# Patient Record
Sex: Female | Born: 1960 | Race: Black or African American | Hispanic: No | Smoking: Former smoker
Health system: Southern US, Community
[De-identification: ages and names within clinical notes are randomized; demographics above are authoritative.]

## PROBLEM LIST (undated history)

## (undated) DIAGNOSIS — R112 Nausea with vomiting, unspecified: Secondary | ICD-10-CM

## (undated) DIAGNOSIS — Z9889 Other specified postprocedural states: Secondary | ICD-10-CM

## (undated) DIAGNOSIS — D649 Anemia, unspecified: Secondary | ICD-10-CM

## (undated) DIAGNOSIS — D219 Benign neoplasm of connective and other soft tissue, unspecified: Secondary | ICD-10-CM

## (undated) DIAGNOSIS — J329 Chronic sinusitis, unspecified: Secondary | ICD-10-CM

## (undated) DIAGNOSIS — I10 Essential (primary) hypertension: Secondary | ICD-10-CM

## (undated) HISTORY — DX: Anemia, unspecified: D64.9

## (undated) HISTORY — PX: UTERINE FIBROID SURGERY: SHX826

---

## 2011-04-15 DIAGNOSIS — F339 Major depressive disorder, recurrent, unspecified: Secondary | ICD-10-CM | POA: Diagnosis not present

## 2011-04-21 DIAGNOSIS — F411 Generalized anxiety disorder: Secondary | ICD-10-CM | POA: Diagnosis not present

## 2011-04-30 DIAGNOSIS — F411 Generalized anxiety disorder: Secondary | ICD-10-CM | POA: Diagnosis not present

## 2011-05-13 DIAGNOSIS — B369 Superficial mycosis, unspecified: Secondary | ICD-10-CM | POA: Diagnosis not present

## 2011-05-13 DIAGNOSIS — I10 Essential (primary) hypertension: Secondary | ICD-10-CM | POA: Diagnosis not present

## 2011-05-13 DIAGNOSIS — R51 Headache: Secondary | ICD-10-CM | POA: Diagnosis not present

## 2011-05-13 DIAGNOSIS — Z23 Encounter for immunization: Secondary | ICD-10-CM | POA: Diagnosis not present

## 2011-05-13 DIAGNOSIS — Z79899 Other long term (current) drug therapy: Secondary | ICD-10-CM | POA: Diagnosis not present

## 2011-05-13 DIAGNOSIS — J309 Allergic rhinitis, unspecified: Secondary | ICD-10-CM | POA: Diagnosis not present

## 2011-05-14 DIAGNOSIS — F411 Generalized anxiety disorder: Secondary | ICD-10-CM | POA: Diagnosis not present

## 2011-05-21 DIAGNOSIS — F411 Generalized anxiety disorder: Secondary | ICD-10-CM | POA: Diagnosis not present

## 2011-05-28 DIAGNOSIS — F411 Generalized anxiety disorder: Secondary | ICD-10-CM | POA: Diagnosis not present

## 2011-06-04 DIAGNOSIS — F411 Generalized anxiety disorder: Secondary | ICD-10-CM | POA: Diagnosis not present

## 2011-06-11 DIAGNOSIS — F411 Generalized anxiety disorder: Secondary | ICD-10-CM | POA: Diagnosis not present

## 2011-06-18 DIAGNOSIS — F411 Generalized anxiety disorder: Secondary | ICD-10-CM | POA: Diagnosis not present

## 2011-06-25 DIAGNOSIS — F411 Generalized anxiety disorder: Secondary | ICD-10-CM | POA: Diagnosis not present

## 2011-07-02 DIAGNOSIS — F411 Generalized anxiety disorder: Secondary | ICD-10-CM | POA: Diagnosis not present

## 2011-07-09 DIAGNOSIS — F411 Generalized anxiety disorder: Secondary | ICD-10-CM | POA: Diagnosis not present

## 2011-07-23 DIAGNOSIS — F411 Generalized anxiety disorder: Secondary | ICD-10-CM | POA: Diagnosis not present

## 2011-07-30 DIAGNOSIS — F411 Generalized anxiety disorder: Secondary | ICD-10-CM | POA: Diagnosis not present

## 2011-08-06 DIAGNOSIS — F411 Generalized anxiety disorder: Secondary | ICD-10-CM | POA: Diagnosis not present

## 2011-08-13 DIAGNOSIS — F411 Generalized anxiety disorder: Secondary | ICD-10-CM | POA: Diagnosis not present

## 2011-08-20 DIAGNOSIS — F411 Generalized anxiety disorder: Secondary | ICD-10-CM | POA: Diagnosis not present

## 2011-08-26 DIAGNOSIS — F339 Major depressive disorder, recurrent, unspecified: Secondary | ICD-10-CM | POA: Diagnosis not present

## 2011-08-27 DIAGNOSIS — F411 Generalized anxiety disorder: Secondary | ICD-10-CM | POA: Diagnosis not present

## 2011-10-15 DIAGNOSIS — F411 Generalized anxiety disorder: Secondary | ICD-10-CM | POA: Diagnosis not present

## 2011-10-29 DIAGNOSIS — F411 Generalized anxiety disorder: Secondary | ICD-10-CM | POA: Diagnosis not present

## 2011-12-17 DIAGNOSIS — F411 Generalized anxiety disorder: Secondary | ICD-10-CM | POA: Diagnosis not present

## 2012-01-05 DIAGNOSIS — F411 Generalized anxiety disorder: Secondary | ICD-10-CM | POA: Diagnosis not present

## 2012-02-16 DIAGNOSIS — F339 Major depressive disorder, recurrent, unspecified: Secondary | ICD-10-CM | POA: Diagnosis not present

## 2012-02-18 ENCOUNTER — Encounter (HOSPITAL_COMMUNITY): Payer: Self-pay

## 2012-02-18 ENCOUNTER — Emergency Department (HOSPITAL_COMMUNITY): Payer: Medicare Other

## 2012-02-18 ENCOUNTER — Inpatient Hospital Stay (HOSPITAL_COMMUNITY)
Admission: EM | Admit: 2012-02-18 | Discharge: 2012-02-19 | DRG: 812 | Disposition: A | Discharge status: Home / Self Care | Payer: Medicare Other | Attending: Family Medicine | Admitting: Family Medicine

## 2012-02-18 DIAGNOSIS — N920 Excessive and frequent menstruation with regular cycle: Secondary | ICD-10-CM | POA: Diagnosis present

## 2012-02-18 DIAGNOSIS — D250 Submucous leiomyoma of uterus: Secondary | ICD-10-CM | POA: Diagnosis present

## 2012-02-18 DIAGNOSIS — D62 Acute posthemorrhagic anemia: Secondary | ICD-10-CM | POA: Diagnosis not present

## 2012-02-18 DIAGNOSIS — D259 Leiomyoma of uterus, unspecified: Secondary | ICD-10-CM | POA: Diagnosis not present

## 2012-02-18 DIAGNOSIS — D649 Anemia, unspecified: Secondary | ICD-10-CM | POA: Diagnosis not present

## 2012-02-18 DIAGNOSIS — R5383 Other fatigue: Secondary | ICD-10-CM | POA: Diagnosis not present

## 2012-02-18 DIAGNOSIS — N938 Other specified abnormal uterine and vaginal bleeding: Secondary | ICD-10-CM | POA: Diagnosis not present

## 2012-02-18 DIAGNOSIS — R5381 Other malaise: Secondary | ICD-10-CM | POA: Diagnosis not present

## 2012-02-18 DIAGNOSIS — R079 Chest pain, unspecified: Secondary | ICD-10-CM | POA: Diagnosis not present

## 2012-02-18 DIAGNOSIS — D219 Benign neoplasm of connective and other soft tissue, unspecified: Secondary | ICD-10-CM

## 2012-02-18 DIAGNOSIS — N925 Other specified irregular menstruation: Secondary | ICD-10-CM | POA: Diagnosis not present

## 2012-02-18 DIAGNOSIS — N949 Unspecified condition associated with female genital organs and menstrual cycle: Secondary | ICD-10-CM | POA: Diagnosis not present

## 2012-02-18 HISTORY — DX: Benign neoplasm of connective and other soft tissue, unspecified: D21.9

## 2012-02-18 HISTORY — DX: Essential (primary) hypertension: I10

## 2012-02-18 HISTORY — DX: Chronic sinusitis, unspecified: J32.9

## 2012-02-18 LAB — COMPREHENSIVE METABOLIC PANEL
Albumin: 3.3 g/dL — ABNORMAL LOW (ref 3.5–5.2)
BUN: 9 mg/dL (ref 6–23)
Creatinine, Ser: 0.95 mg/dL (ref 0.50–1.10)
GFR calc Af Amer: 80 mL/min — ABNORMAL LOW (ref >90)
Glucose, Bld: 106 mg/dL — ABNORMAL HIGH (ref 70–99)
Total Protein: 7.5 g/dL (ref 6.0–8.3)

## 2012-02-18 LAB — TROPONIN I: Troponin I: <0.3 ng/mL (ref <0.30)

## 2012-02-18 LAB — CBC WITH DIFFERENTIAL/PLATELET
Basophils Absolute: 0.1 10*3/uL (ref 0.0–0.1)
Basophils Relative: 1 % (ref 0–1)
Eosinophils Absolute: 0.1 10*3/uL (ref 0.0–0.7)
HCT: 20.4 % — ABNORMAL LOW (ref 36.0–46.0)
Lymphocytes Relative: 20 % (ref 12–46)
Lymphs Abs: 1.6 10*3/uL (ref 0.7–4.0)
MCH: 13.5 pg — ABNORMAL LOW (ref 26.0–34.0)
MCHC: 26.5 g/dL — ABNORMAL LOW (ref 30.0–36.0)
MCV: 50.9 fL — ABNORMAL LOW (ref 78.0–100.0)
Monocytes Absolute: 0.6 10*3/uL (ref 0.1–1.0)
Neutro Abs: 5.6 10*3/uL (ref 1.7–7.7)
RDW: 21.8 % — ABNORMAL HIGH (ref 11.5–15.5)

## 2012-02-18 LAB — PREPARE RBC (CROSSMATCH)

## 2012-02-18 LAB — PROTIME-INR
INR: 0.98 (ref 0.00–1.49)
Prothrombin Time: 12.9 seconds (ref 11.6–15.2)

## 2012-02-18 LAB — ABO/RH: ABO/RH(D): A POS

## 2012-02-18 NOTE — ED Provider Notes (Signed)
History     CSN: [id number]  Arrival date & time 02/18/12  2021   First MD Initiated Contact with Patient 02/18/12 2032      Chief Complaint  Patient presents with  . Abnormal Lab  . Vaginal Bleeding    (Consider location/radiation/quality/duration/timing/severity/associated sxs/prior treatment) The history is provided by the patient.  Beverly Bryant is a 51 y.o. female hx of uterine fibroids s/p resections previously here with anemia. She has been bleeding about 1 pad an hour for the last two months. She feels lightheaded and dizzy. Also some SOB and CP. Denies any melena or vomiting. Went to PMD, had Hg 5 so sent for eval.      Past Medical History  Diagnosis Date  . Fibroids   . Hypertension   . Sinusitis     Past Surgical History  Procedure Date  . Uterine fibroid surgery     History reviewed. No pertinent family history.  History  Substance Use Topics  . Smoking status: Former Games developer  . Smokeless tobacco: Not on file  . Alcohol Use: No    OB History    Grav Para Term Preterm Abortions TAB SAB Ect Mult Living                  Review of Systems  Respiratory: Positive for shortness of breath.   Cardiovascular: Positive for chest pain.  Genitourinary: Positive for vaginal bleeding.  Neurological: Positive for light-headedness.  All other systems reviewed and are negative.    Allergies  Review of patient's allergies indicates no known allergies.  Home Medications   Current Outpatient Rx  Name  Route  Sig  Dispense  Refill  . MOMETASONE FUROATE 50 MCG/ACT NA SUSP   Nasal   Place 2 sprays into the nose 2 (two) times daily.         Marland Kitchen PROPRANOLOL HCL ER BEADS 80 MG PO CP24   Oral   Take 80 mg by mouth at bedtime.           BP 138/75  Pulse 95  Temp 99.3 F (37.4 C) (Oral)  Resp 22  SpO2 98%  LMP 02/18/2012  Physical Exam  Nursing note and vitals reviewed. Constitutional: She is oriented to person, place, and time. She  appears well-developed and well-nourished.       Obese, NAD   HENT:  Head: Normocephalic.  Mouth/Throat: Oropharynx is clear and moist.  Eyes: Pupils are equal, round, and reactive to light.       Conjunctiva pale   Neck: Normal range of motion. Neck supple.  Cardiovascular: Normal rate, regular rhythm and normal heart sounds.   Pulmonary/Chest: Effort normal and breath sounds normal. No respiratory distress. She has no wheezes.  Abdominal: Soft. Bowel sounds are normal. She exhibits no distension. There is no tenderness. There is no rebound.  Genitourinary:       + tender fibroids that are enlarged. Some blood at os, there is a cervical fibroid present as well. No adnexal tenderness.   Musculoskeletal: She exhibits no edema and no tenderness.  Neurological: She is alert and oriented to person, place, and time.  Skin: Skin is warm and dry.  Psychiatric: She has a normal mood and affect. Her behavior is normal. Judgment and thought content normal.    ED Course  Procedures (including critical care time)  Labs Reviewed  CBC WITH DIFFERENTIAL - Abnormal; Notable for the following:    Hemoglobin 5.4 (*)     HCT 20.4 (*)  MCV 50.9 (*)     MCH 13.5 (*)     MCHC 26.5 (*)     RDW 21.8 (*)     All other components within normal limits  COMPREHENSIVE METABOLIC PANEL - Abnormal; Notable for the following:    Glucose, Bld 106 (*)     Albumin 3.3 (*)     Total Bilirubin 0.2 (*)     GFR calc non Af Amer 69 (*)     GFR calc Af Amer 80 (*)     All other components within normal limits  TYPE AND SCREEN  TROPONIN I  ABO/RH  PREPARE RBC (CROSSMATCH)   Dg Chest 2 View  02/18/2012  *RADIOLOGY REPORT*  Clinical Data: Chest pain.  CHEST - 2 VIEW  Comparison: None.  Findings: Heart size and pulmonary vascularity are normal and the lungs are clear.  No osseous abnormality.  IMPRESSION: Normal chest.   Original Report Authenticated By: Francene Boyers, M.D.      No diagnosis found.   Date:  02/18/2012  Rate: 80  Rhythm: normal sinus rhythm  QRS Axis: normal  Intervals: normal  ST/T Wave abnormalities: normal  Conduction Disutrbances:none  Narrative Interpretation:   Old EKG Reviewed: none available    MDM  Beverly Bryant is a 51 y.o. female here with symptomatic anemia likely secondary to vaginal bleeding. Will transfuse patient and likely will need admission.   10:07 PM Hg 5.5. I discussed with Dr. Candace Gallus at [facility] Will start transfusion and transfer to [facility] hospital.         Richardean Canal, MD 02/18/12 2211

## 2012-02-18 NOTE — ED Notes (Signed)
Pt states she has been having long periods and large amounts of clotting. Went to MD.  Per MD, come straight to hospital.  Hgb is 5.

## 2012-02-19 ENCOUNTER — Observation Stay (HOSPITAL_COMMUNITY): Payer: Medicare Other

## 2012-02-19 DIAGNOSIS — D250 Submucous leiomyoma of uterus: Secondary | ICD-10-CM | POA: Diagnosis not present

## 2012-02-19 DIAGNOSIS — N920 Excessive and frequent menstruation with regular cycle: Secondary | ICD-10-CM | POA: Diagnosis present

## 2012-02-19 DIAGNOSIS — D259 Leiomyoma of uterus, unspecified: Secondary | ICD-10-CM | POA: Diagnosis not present

## 2012-02-19 DIAGNOSIS — D219 Benign neoplasm of connective and other soft tissue, unspecified: Secondary | ICD-10-CM

## 2012-02-19 DIAGNOSIS — D62 Acute posthemorrhagic anemia: Secondary | ICD-10-CM | POA: Diagnosis not present

## 2012-02-19 LAB — CBC
Hemoglobin: 7.9 g/dL — ABNORMAL LOW (ref 12.0–15.0)
MCHC: 29.9 g/dL — ABNORMAL LOW (ref 30.0–36.0)
Platelets: 246 10*3/uL (ref 150–400)
RDW: 31.5 % — ABNORMAL HIGH (ref 11.5–15.5)

## 2012-02-19 LAB — PREPARE RBC (CROSSMATCH)

## 2012-02-19 MED ORDER — TEMAZEPAM 15 MG PO CAPS: 15.0000 mg | ORAL_CAPSULE | Freq: Every evening | ORAL | Status: DC | PRN

## 2012-02-19 MED ORDER — OXYCODONE-ACETAMINOPHEN 5-325 MG PO TABS: 1.0000 | ORAL_TABLET | ORAL | Status: DC | PRN

## 2012-02-19 MED ORDER — DSS 100 MG PO CAPS: 100.0000 mg | ORAL_CAPSULE | Freq: Two times a day (BID) | ORAL | Status: AC

## 2012-02-19 MED ORDER — IBUPROFEN 600 MG PO TABS: 600.0000 mg | ORAL_TABLET | Freq: Four times a day (QID) | ORAL | Status: AC | PRN

## 2012-02-19 MED ORDER — FLUTICASONE PROPIONATE 50 MCG/ACT NA SUSP
2.0000 | Freq: Every day | NASAL | Status: DC
  Administered 2012-02-19: 2 via NASAL
  Filled 2012-02-19: qty 16

## 2012-02-19 MED ORDER — ALUM & MAG HYDROXIDE-SIMETH 200-200-20 MG/5ML PO SUSP: 30.0000 mL | ORAL | Status: DC | PRN

## 2012-02-19 MED ORDER — SODIUM CHLORIDE 0.9 % IV SOLN
INTRAVENOUS | Status: AC
  Administered 2012-02-19: 02:00:00 via INTRAVENOUS

## 2012-02-19 MED ORDER — PROPRANOLOL HCL ER 80 MG PO CP24
80.0000 mg | ORAL_CAPSULE | Freq: Every day | ORAL | Status: DC
  Filled 2012-02-19: qty 1

## 2012-02-19 MED ORDER — IBUPROFEN 600 MG PO TABS: 600.0000 mg | ORAL_TABLET | Freq: Four times a day (QID) | ORAL | Status: DC | PRN

## 2012-02-19 MED ORDER — MENTHOL 3 MG MT LOZG: 1.0000 | LOZENGE | OROMUCOSAL | Status: DC | PRN

## 2012-02-19 MED ORDER — DOCUSATE SODIUM 100 MG PO CAPS
100.0000 mg | ORAL_CAPSULE | Freq: Two times a day (BID) | ORAL | Status: DC
  Administered 2012-02-19: 100 mg via ORAL
  Filled 2012-02-19: qty 1

## 2012-02-19 MED ORDER — FERROUS SULFATE 325 (65 FE) MG PO TABS: 325.0000 mg | ORAL_TABLET | Freq: Two times a day (BID) | ORAL | Status: DC

## 2012-02-19 MED ORDER — MEDROXYPROGESTERONE ACETATE 10 MG PO TABS
10.0000 mg | ORAL_TABLET | Freq: Two times a day (BID) | ORAL | Status: DC
  Administered 2012-02-19 (×2): 10 mg via ORAL
  Filled 2012-02-19 (×4): qty 1

## 2012-02-19 MED ORDER — MEDROXYPROGESTERONE ACETATE 150 MG/ML IM SUSP
150.0000 mg | Freq: Once | INTRAMUSCULAR | Status: AC
  Administered 2012-02-19: 150 mg via INTRAMUSCULAR
  Filled 2012-02-19: qty 1

## 2012-02-19 MED ORDER — MEDROXYPROGESTERONE ACETATE 10 MG PO TABS: 10.0000 mg | ORAL_TABLET | Freq: Two times a day (BID) | ORAL | Status: DC

## 2012-02-19 MED ORDER — GUAIFENESIN 100 MG/5ML PO SOLN: 15.0000 mL | ORAL | Status: DC | PRN

## 2012-02-19 MED ORDER — SODIUM CHLORIDE 0.9 % IV SOLN
INTRAVENOUS | Status: DC
Start: 2012-02-19 — End: 2012-02-19
  Administered 2012-02-19: 02:00:00 via INTRAVENOUS

## 2012-02-19 NOTE — Progress Notes (Signed)
Pt is discharged in the care of friend. Downstairs per ambulatory. Stable, Denies any pain or discomfort. Denies any vaginal bleeding. Understands all instructions well.  Questions asked and answered.

## 2012-02-19 NOTE — H&P (Signed)
Natsuko Kelsay is an 51 y.o. No obstetric history on file. Unknown female.   Chief Complaint: Vaginal bleeding, dizziness HPI: 51 y.o.G0 with long standing h/o fibroid uterus.  Previous myomectomy x 2.  Bleeding x 2 months.  hgb at PCP today 5.  Came to ED and transferred here for definitive management.  Past Medical History  Diagnosis Date  . Fibroids   . Hypertension   . Sinusitis     Past Surgical History  Procedure Date  . Uterine fibroid surgery     History reviewed. No pertinent family history. Social History:  reports that she has quit smoking. She does not have any smokeless tobacco history on file. She reports that she does not drink alcohol or use illicit drugs.  Allergies: No Known Allergies  Medications Prior to Admission  Medication Sig Dispense Refill  . mometasone (NASONEX) 50 MCG/ACT nasal spray Place 2 sprays into the nose 2 (two) times daily.      . propranolol (INNOPRAN XL) 80 MG 24 hr capsule Take 80 mg by mouth at bedtime.        ROS Constitutional: negative for chills and fevers Respiratory: positive for dyspnea on exertion Cardiovascular: positive for dyspnea and fatigue Genitourinary:positive for heavy vaginal bleeding Otherwise negative  Blood pressure 127/66, pulse 68, temperature 98.1 F (36.7 C), temperature source Oral, resp. rate 18, height 5\' 8"  (1.727 m), weight 90.719 kg (200 lb), last menstrual period 02/18/2012, SpO2 100.00%. BP 127/66  Pulse 68  Temp 98.1 F (36.7 C) (Oral)  Resp 18  Ht 5\' 8"  (1.727 m)  Wt 90.719 kg (200 lb)  BMI 30.41 kg/m2  SpO2 100%  LMP 02/18/2012 General appearance: alert, cooperative and appears stated age Head: Normocephalic, without obvious abnormality, atraumatic Neck: supple, symmetrical, trachea midline Lungs: normal effort Heart: regular rate and rhythm Abdomen: normal findings: soft, non-tender and abnormal findings:  mass, located in the lower abdomen Extremities: extremities normal,  atraumatic, no cyanosis or edema Skin: Skin color, texture, turgor normal. No rashes or lesions   Lab Results  Component Value Date   WBC 8.0 02/18/2012   HGB 5.4* 02/18/2012   HCT 20.4* 02/18/2012   MCV 50.9* 02/18/2012   PLT 324 02/18/2012   No results found for this basename: PREGTESTUR, PREGSERUM, HCG, HCGQUANT     Assessment/Plan Patient Active Problem List  Diagnosis  . Menorrhagia  . Acute blood loss anemia  . Fibroids   S/p 3 u PRBC's. Provera to stop bleeding Pelvic u/s today Consider endobx today.  PRATT,TANYA S 02/19/2012, 7:53 AM

## 2012-02-19 NOTE — Discharge Summary (Signed)
Physician Discharge Summary  Patient ID: Beverly Bryant MRN: [id number] DOB/AGE: 06/07/1960 51 y.o.  Admit date: 02/18/2012 Discharge date: 02/19/2012  Admission Diagnoses: Symptomatic anemia, menorrhagia  Discharge Diagnoses: Fibroid uterus, s/p blood transfusion Principal Problem:  *Acute blood loss anemia Active Problems:  Menorrhagia  Fibroids   Discharged Condition: good  Hospital Course: Patient admitted with symptomatic anemia secondary to menorrhagia. Patient reports experiencing vaginal bleeding for the past 2 months. Patient received a blood transfusion of 3 units pRBC and felt significantly better. Her post transfusion cbc was found to be 7.9. She reports persistent vaginal bleeding, changing pads every 2 hours. Pelvic ultrasound demonstrated an 18 week fibroid uterus with a 10 cm submucosal fibroid. Patient was found to be stable for discharge with plans to follow-up in our clinic in 2-3 weeks for endometrial biopsy and surgical consult for hysterectomy. Patient is to receive depo-provera prior to discharge.  Consults: None  Significant Diagnostic Studies: labs: pre-transfusion 5.4--> post transfusion 7.9 and radiology: Ultrasound: 18 week size uterus with 2 prominent fibroids, the largest measuring 10 cm and is in a submucosal position. Normal ovaries x 2  Treatments: 3 units pRBC, provera  Discharge Exam: Blood pressure 128/76, pulse 72, temperature 98 F (36.7 C), temperature source Oral, resp. rate 18, height 5\' 8"  (1.727 m), weight 90.719 kg (200 lb), last menstrual period 02/18/2012, SpO2 100.00%. General appearance: alert, cooperative and no distress Resp: clear to auscultation bilaterally Cardio: regular rate and rhythm GI: soft, non-tender; bowel sounds normal; no masses,  no organomegaly Pelvic: cervix normal in appearance, external genitalia normal, no adnexal masses or tenderness, vagina normal without discharge and Approximately 20 week size  uterus with palpable mass in posterior cul de sac. small amount of blood in vaginal vault. Extremities: Homans sign is negative, no sign of DVT and no edema, redness or tenderness in the calves or thighs  Disposition: Final discharge disposition not confirmed     Medication List     As of 02/19/2012 12:55 PM    TAKE these medications         DSS 100 MG Caps   Take 100 mg by mouth 2 (two) times daily.      ferrous sulfate 325 (65 FE) MG tablet   Take 1 tablet (325 mg total) by mouth 2 (two) times daily.      ibuprofen 600 MG tablet   Commonly known as: ADVIL,MOTRIN   Take 1 tablet (600 mg total) by mouth every 6 (six) hours as needed (mild pain).      medroxyPROGESTERone 10 MG tablet   Commonly known as: PROVERA   Take 1 tablet (10 mg total) by mouth 2 (two) times daily.      mometasone 50 MCG/ACT nasal spray   Commonly known as: NASONEX   Place 2 sprays into the nose 2 (two) times daily.      oxyCODONE-acetaminophen 5-325 MG per tablet   Commonly known as: PERCOCET/ROXICET   Take 1 tablet by mouth every 4 (four) hours as needed (moderate to severe pain (when tolerating fluids)).      propranolol 80 MG 24 hr capsule   Commonly known as: INNOPRAN XL   Take 80 mg by mouth at bedtime.        Follow-up Information    Please follow up. (An appointment will be scheduled for you in 2-3 weeks. If you do not receive a phone call by tuesday, you may contact our clinic number [phone] and let them know that you were  recently released from [facility] hospital)          Signed: Shamari Lofquist 02/19/2012, 12:55 PM

## 2012-02-19 NOTE — Progress Notes (Signed)
UR Chart review completed.  

## 2012-02-20 LAB — TYPE AND SCREEN

## 2012-02-22 ENCOUNTER — Ambulatory Visit: Payer: BC Managed Care – PPO | Admitting: Obstetrics & Gynecology

## 2012-02-22 ENCOUNTER — Encounter: Payer: Self-pay | Admitting: Obstetrics & Gynecology

## 2012-02-22 ENCOUNTER — Other Ambulatory Visit (HOSPITAL_COMMUNITY)
Admission: RE | Admit: 2012-02-22 | Discharge: 2012-02-22 | Disposition: A | Discharge status: Home / Self Care | Payer: Medicare Other | Source: Ambulatory Visit | Attending: Obstetrics & Gynecology | Admitting: Obstetrics & Gynecology

## 2012-02-22 VITALS — BP 136/76 | HR 81 | Ht 68.0 in | Wt 252.1 lb

## 2012-02-22 DIAGNOSIS — N920 Excessive and frequent menstruation with regular cycle: Secondary | ICD-10-CM

## 2012-02-22 DIAGNOSIS — D259 Leiomyoma of uterus, unspecified: Secondary | ICD-10-CM | POA: Diagnosis not present

## 2012-02-22 DIAGNOSIS — Z01812 Encounter for preprocedural laboratory examination: Secondary | ICD-10-CM | POA: Diagnosis not present

## 2012-02-22 DIAGNOSIS — D219 Benign neoplasm of connective and other soft tissue, unspecified: Secondary | ICD-10-CM

## 2012-02-22 LAB — TYPE AND SCREEN
ABO/RH(D): A POS
Antibody Screen: NEGATIVE
Unit division: 0

## 2012-02-22 LAB — POCT PREGNANCY, URINE: Preg Test, Ur: NEGATIVE

## 2012-02-22 NOTE — Patient Instructions (Addendum)
Uterine Fibroid  A uterine fibroid is a growth (tumor) that occurs in a woman's uterus. This type of tumor is not cancerous and does not spread out of the uterus. A woman can have one or many fibroids, and the fiboid(s) can become quite large. A fibroid can vary in size, weight, and where it grows in the uterus. Most fibroids do not require medical treatment, but some can cause pain or heavy bleeding during and between periods.  CAUSES   A fibroid is the result of a single uterine cell that keeps growing (unregulated), which is different than most cells in the human body. Most cells have a control mechanism that keeps them from reproducing without control.   SYMPTOMS    Bleeding.   Pelvic pain and pressure.   Bladder problems due to the size of the fibroid.   Infertility and miscarriages depending on the size and location of the fibroid.  DIAGNOSIS   A diagnosis is made by physical exam. Your caregiver may feel the lumpy tumors during a pelvic exam. Important information regarding size, location, and number of tumors can be gained by having an ultrasound. It is rare that other tests, such as a CT scan or MRI, are needed.  TREATMENT    Your caregiver may recommend watchful waiting. This involves getting the fibroid checked by your caregiver to see if the fibroids grow or shrink.    Hormonal treatment or an intrauterine device (IUD) may be prescribed.    Surgery may be needed to remove the fibroids (myomectomy) or the uterus (hysterectomy). This depends on your situation.  When fibroids interfere with fertility and a woman wants to become pregnant, a caregiver may recommend having the fibroids removed.   HOME CARE INSTRUCTIONS   Home care depends on how you were treated. In general:    Keep all follow-up appointments with your caregiver.    Only take medicine as told by your caregiver. Do not take aspirin. It can cause bleeding.    If you have excessive periods and soak tampons or pads in a half hour or  less, contact your caregiver immediately. If your periods are troublesome but not so heavy, lie down with your feet raised slightly above your heart. Place cold packs on your lower abdomen.    If your periods are heavy, write down the number of pads or tampons you use per month. Bring this information to your caregiver.    Talk to your caregiver about taking iron pills.    Include green vegetables in your diet.    If you were prescribed a hormonal treatment, take the hormonal medicines as directed.    If you need surgery, ask your caregiver for information on your specific surgery.   SEEK IMMEDIATE MEDICAL CARE IF:   You have pelvic pain or cramps not controlled with medicines.    You have a sudden increase in pelvic pain.    You have an increase of bleeding between and during periods.    You feel lightheaded or have fainting episodes.   MAKE SURE YOU:   Understand these instructions.   Will watch your condition.   Will get help right away if you are not doing well or get worse.  Document Released: 03/20/2000 Document Revised: 06/15/2011 Document Reviewed: 04/13/2011  [employer] Patient Information 2013 [employer].

## 2012-02-22 NOTE — Progress Notes (Signed)
Patient ID: Beverly Bryant, female   DOB: December 06, 1960, 51 y.o.   MRN: [id number]  Chief Complaint  Patient presents with  . Menorrhagia  . Fibroids    HPI Beverly Bryant is a 51 y.o. female.  History of fibroids and menorrhagia, recently discharged after transfusion for anemia. On Provera. HPI  Past Medical History  Diagnosis Date  . Fibroids   . Hypertension   . Sinusitis   . Anemia     Past Surgical History  Procedure Date  . Uterine fibroid surgery   2 abdominal myomectomies in [place]  No family history on file.  Social History History  Substance Use Topics  . Smoking status: Former Games developer  . Smokeless tobacco: Not on file  . Alcohol Use: No    No Known Allergies  Current Outpatient Prescriptions  Medication Sig Dispense Refill  . docusate sodium 100 MG CAPS Take 100 mg by mouth 2 (two) times daily.  30 capsule  3  . ferrous sulfate (FERROUSUL) 325 (65 FE) MG tablet Take 1 tablet (325 mg total) by mouth 2 (two) times daily.  60 tablet  2  . ibuprofen (ADVIL,MOTRIN) 600 MG tablet Take 1 tablet (600 mg total) by mouth every 6 (six) hours as needed (mild pain).  30 tablet  2  . medroxyPROGESTERone (PROVERA) 10 MG tablet Take 1 tablet (10 mg total) by mouth 2 (two) times daily.  60 tablet  1  . mometasone (NASONEX) 50 MCG/ACT nasal spray Place 2 sprays into the nose 2 (two) times daily.      . propranolol (INNOPRAN XL) 80 MG 24 hr capsule Take 80 mg by mouth at bedtime.      Marland Kitchen oxyCODONE-acetaminophen (PERCOCET/ROXICET) 5-325 MG per tablet Take 1 tablet by mouth every 4 (four) hours as needed (moderate to severe pain (when tolerating fluids)).  20 tablet  0    Review of Systems Review of Systems  Genitourinary: Positive for menstrual problem. Negative for dysuria.  Neurological: Negative for dizziness.    Blood pressure 136/76, pulse 81, height 5\' 8"  (1.727 m), weight 252 lb 1.6 oz (114.352 kg), last menstrual period 02/18/2012.  Physical  Exam Physical Exam  Constitutional: No distress.  Pulmonary/Chest: No respiratory distress.  Genitourinary:       Dark blood in vault, nulliparous. Cervix prepped, pipelle passed to 11 cm and bloody material for biopsy obtained  Skin: Skin is warm and dry.  Psychiatric: She has a normal mood and affect. Her behavior is normal.    Data Reviewed Ultrasound   Assessment    Fibroids and menometrorrhagia    Plan    RTC for Bx result       Beverly Bryant 02/22/2012, 1:54 PM

## 2012-02-25 DIAGNOSIS — F411 Generalized anxiety disorder: Secondary | ICD-10-CM | POA: Diagnosis not present

## 2012-03-07 ENCOUNTER — Encounter: Payer: Self-pay | Admitting: Obstetrics and Gynecology

## 2012-03-07 ENCOUNTER — Ambulatory Visit (INDEPENDENT_AMBULATORY_CARE_PROVIDER_SITE_OTHER): Payer: BC Managed Care – PPO | Admitting: Obstetrics and Gynecology

## 2012-03-07 VITALS — BP 132/80 | HR 71 | Ht 68.0 in | Wt 251.3 lb

## 2012-03-07 DIAGNOSIS — Z1231 Encounter for screening mammogram for malignant neoplasm of breast: Secondary | ICD-10-CM | POA: Diagnosis not present

## 2012-03-07 DIAGNOSIS — N920 Excessive and frequent menstruation with regular cycle: Secondary | ICD-10-CM | POA: Diagnosis not present

## 2012-03-07 DIAGNOSIS — Z01419 Encounter for gynecological examination (general) (routine) without abnormal findings: Secondary | ICD-10-CM

## 2012-03-07 DIAGNOSIS — D259 Leiomyoma of uterus, unspecified: Secondary | ICD-10-CM | POA: Diagnosis not present

## 2012-03-07 DIAGNOSIS — D219 Benign neoplasm of connective and other soft tissue, unspecified: Secondary | ICD-10-CM

## 2012-03-07 NOTE — Progress Notes (Signed)
  Subjective:    Patient ID: Beverly Bryant, female    DOB: 12-11-1960, 51 y.o.   MRN: [id number]  HPI 51 yo G0 with dysfunctional uterine bleeding secondary to fibroid uterus presenting today to discuss results of endometrial biopsy and schedule surgical intervention. Patient was recently transfused in November 2013 secondary to symptomatic anemia. Patient is currently being medically treated with depo-provera and oral provera. She reports significant improvement in her vaginal bleeding which is still persistent.  Past Medical History  Diagnosis Date  . Fibroids   . Hypertension   . Sinusitis   . Anemia    Past Surgical History  Procedure Date  . Uterine fibroid surgery    No family history on file.  History  Substance Use Topics  . Smoking status: Former Games developer  . Smokeless tobacco: Not on file  . Alcohol Use: No      Review of Systems  All other systems reviewed and are negative.       Objective:   Physical Exam  GENERAL: Well-developed, well-nourished female in no acute distress.  HEENT: Normocephalic, atraumatic. Sclerae anicteric.  NECK: Supple. Normal thyroid.  LUNGS: Clear to auscultation bilaterally.  HEART: Regular rate and rhythm. ABDOMEN: Soft, nontender, nondistended. Palpable fibroid uterus. PELVIC: Normal external female genitalia. Vagina is pink and rugated.  Normal discharge. Normal appearing cervix. Uterus is 20-week in size. No adnexal mass or tenderness. EXTREMITIES: No cyanosis, clubbing, or edema, 2+ distal pulses.   11/15 ultrasound: Uterus: The uterus is anteverted and enlarged, measuring 18.8 x 9.6 x 9.8 cm. A posterior lower uterine body intramural fibroid with a submucosal component measures 8.4 x 10.7 x 6.1 cm. An intramural  fundal fibroid measures 4.3 x 3.8 x 3.5 cm. Endometrium: Difficult to visualize due to the presence of the fibroids. On transvaginal imaging, the endometrium is estimated to be 1.1 cm in thickness.  Right  ovary: Normal appearance/no adnexal mass. Measures 3.2 x 2.3 x 2.3 cm.  Left ovary: Normal appearance/no adnexal mass. Measures 2.8 x 1.6 x 2.4 cm.  Other findings: No free fluid visualized.   11/18 Endometrium, biopsy - FRAGMENTS OF BENIGN ENDOMETRIUM WITH PROGESTERONE INDUCED STROMAL CHANGE AND BREAKDOWN. - NEGATIVE FOR HYPERPLASIA OR MALIGNANCY. - BENIGN ENDOCERVICAL MUCOSA.    Assessment & Plan:  51 yo with DUB and fibroid uterus - Results of endometrial biospy reviewed and explained to the patient - Discussed definitive management with hysterectomy. 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 Dr. Dolan Amen in to assess patient for robotic assisted hysterectomy - Pap smear performed today - Patient will be contacted by surgical scheduler for robotic hysterectomy. Will continue medical management in the meantime

## 2012-03-10 ENCOUNTER — Ambulatory Visit (HOSPITAL_COMMUNITY): Payer: BC Managed Care – PPO

## 2012-03-10 DIAGNOSIS — F411 Generalized anxiety disorder: Secondary | ICD-10-CM | POA: Diagnosis not present

## 2012-03-21 DIAGNOSIS — F411 Generalized anxiety disorder: Secondary | ICD-10-CM | POA: Diagnosis not present

## 2012-03-29 ENCOUNTER — Ambulatory Visit (HOSPITAL_COMMUNITY)
Admission: RE | Admit: 2012-03-29 | Discharge: 2012-03-29 | Disposition: A | Discharge status: Home / Self Care | Payer: Medicare Other | Source: Ambulatory Visit | Attending: Obstetrics and Gynecology | Admitting: Obstetrics and Gynecology

## 2012-03-29 DIAGNOSIS — Z1231 Encounter for screening mammogram for malignant neoplasm of breast: Secondary | ICD-10-CM | POA: Insufficient documentation

## 2012-03-29 DIAGNOSIS — Z01419 Encounter for gynecological examination (general) (routine) without abnormal findings: Secondary | ICD-10-CM

## 2012-04-20 ENCOUNTER — Ambulatory Visit (INDEPENDENT_AMBULATORY_CARE_PROVIDER_SITE_OTHER): Payer: BC Managed Care – PPO | Admitting: Obstetrics & Gynecology

## 2012-04-20 ENCOUNTER — Encounter: Payer: Self-pay | Admitting: Obstetrics & Gynecology

## 2012-04-20 VITALS — BP 159/90 | HR 63 | Temp 99.3°F | Ht 68.0 in | Wt 250.9 lb

## 2012-04-20 DIAGNOSIS — D259 Leiomyoma of uterus, unspecified: Secondary | ICD-10-CM

## 2012-04-20 DIAGNOSIS — D219 Benign neoplasm of connective and other soft tissue, unspecified: Secondary | ICD-10-CM

## 2012-04-20 NOTE — Progress Notes (Signed)
Subjective:     Patient ID: Beverly Bryant, female   DOB: 1960-06-24, 52 y.o.   MRN: [id number]  HPI Pt presents for pre op for robot assisted hyst.  I had previously evaluated pt with Dr. Jolayne Panther.  Pt does not want her parents to know that she is having surgery so that they will not worry.  She visits them every other day but, does not assist them with their ADL's        Past Medical History  Diagnosis Date  . Fibroids   . Hypertension   . Sinusitis   . Anemia    Past Surgical History  Procedure Date  . Uterine fibroid surgery    Current Outpatient Prescriptions on File Prior to Visit  Medication Sig Dispense Refill  . docusate sodium 100 MG CAPS Take 100 mg by mouth 2 (two) times daily.  30 capsule  3  . ferrous sulfate (FERROUSUL) 325 (65 FE) MG tablet Take 1 tablet (325 mg total) by mouth 2 (two) times daily.  60 tablet  2  . ibuprofen (ADVIL,MOTRIN) 600 MG tablet Take 1 tablet (600 mg total) by mouth every 6 (six) hours as needed (mild pain).  30 tablet  2  . medroxyPROGESTERone (PROVERA) 10 MG tablet Take 1 tablet (10 mg total) by mouth 2 (two) times daily.  60 tablet  1  . mometasone (NASONEX) 50 MCG/ACT nasal spray Place 2 sprays into the nose 2 (two) times daily.      . propranolol (INNOPRAN XL) 80 MG 24 hr capsule Take 80 mg by mouth at bedtime.      Marland Kitchen oxyCODONE-acetaminophen (PERCOCET/ROXICET) 5-325 MG per tablet Take 1 tablet by mouth every 4 (four) hours as needed (moderate to severe pain (when tolerating fluids)).  20 tablet  0   No Known Allergies History   Social History  . Marital Status: Divorced    Spouse Name: N/A    Number of Children: N/A  . Years of Education: N/A   Occupational History  . Not on file.   Social History Main Topics  . Smoking status: Former Games developer  . Smokeless tobacco: Not on file  . Alcohol Use: No  . Drug Use: No  . Sexually Active: No   Other Topics Concern  . Not on file   Social History Narrative  . No narrative  on file    Review of Systems     Objective:   Physical ExamBP 159/90  Pulse 63  Temp 99.3 F (37.4 C) (Oral)  Ht 5\' 8"  (1.727 m)  Wt 250 lb 14.4 oz (113.807 kg)  BMI 38.15 kg/m2  LMP 02/11/2012 Lungs: CTA CV: RRR Abd: obese, ventral hernia that is ~10cm GU: EGBUS: no lesions Vagina: no blood in vault. VERY NARROW introitus Cervix: no lesion; no mucopurulent d/c Uterus: >20week sized.  Post fibroid palpable Adnexa: unable to eval   02/19/12 Uterus: The uterus is anteverted and enlarged, measuring 18.8 x 9.6  x 9.8 cm. A posterior lower uterine body intramural fibroid with a  submucosal component measures 8.4 x 10.7 x 6.1 cm. An intramural  fundal fibroid measures 4.3 x 3.8 x 3.5 cm.  Endometrium: Difficult to visualize due to the presence of the  fibroids. On transvaginal imaging, the endometrium is estimated to  be 1.1 cm in thickness.  Right ovary: Normal appearance/no adnexal mass. Measures 3.2 x  2.3 x 2.3 cm.  Left ovary: Normal appearance/no adnexal mass. Measures 2.8 x 1.6  x 2.4 cm.  Other findings: No free fluid visualized.  IMPRESSION:  1. Enlarged fibroid uterus. Two discrete fibroids are visualized,  as described above.  2. Normal ovaries.          Assessment:     Pt was initially scheduled for a robot assisted LAVH.  After reviewing with her the ventral hernia, increased size of her uterus to 20 weeks including the post fibroid with a narrow introitus I reviewed with her the risks vs benefits of a TAH vs the [facility].  After review of the info pt has decided on a TAH with bilateral salpingectomy      Plan:     Patient desires surgical management with TAH with bilateral salpingectomy.  The risks of surgery were discussed in detail with the patient including but not limited to: bleeding which may require transfusion or reoperation; infection which may require prolonged hospitalization or re-hospitalization and antibiotic therapy; injury to bowel, bladder,  ureters and major vessels or other surrounding organs; need for additional procedures including laparotomy; thromboembolic phenomenon, incisional problems and other postoperative or anesthesia complications.  Patient was told that the likelihood that her condition and symptoms will be treated effectively with this surgical management was very high; the postoperative expectations were also discussed in detail. The patient also understands the alternative treatment options which were discussed in full. All questions were answered.  She was told that she will be contacted by our surgical scheduler regarding the time and date of her surgery; routine preoperative instructions of having nothing to eat or drink after midnight on the day prior to surgery and also coming to the hospital 1 1/2 hours prior to her time of surgery were also emphasized.  She was told she may be called for a preoperative appointment about a week prior to surgery and will be given further preoperative instructions at that visit. Printed patient education handouts about the procedure were given to the patient to review at home.

## 2012-04-20 NOTE — Patient Instructions (Signed)
Hysterectomy Care After Refer to this sheet in the next few weeks. These instructions provide you with information on caring for yourself after your procedure. Your caregiver may also give you more specific instructions. Your treatment has been planned according to current medical practices, but problems sometimes occur. Call your caregiver if you have any problems or questions after your procedure. HOME CARE INSTRUCTIONS  Healing will take time. You may have discomfort, tenderness, swelling, and bruising at the surgical site for about 2 weeks. This is normal and will get better as time goes on.  Only take over-the-counter or prescription medicines for pain, discomfort, or fever as directed by your caregiver.  Do not take aspirin. It can cause bleeding.  Do not drive when taking pain medicine.  Follow your caregiver's advice regarding exercise, lifting, driving, and general activities.  Resume your usual diet as directed and allowed.  Get plenty of rest and sleep.  Do not douche, use tampons, or have sexual intercourse for at least 6 weeks or until your caregiver gives you permission.  Change your bandages (dressings) as directed by your caregiver.  Monitor your temperature.  Take showers instead of baths for 2 to 3 weeks.  Do not drink alcohol until your caregiver gives you permission.  If you are constipated, you may take a mild laxative with your caregiver's permission. Bran foods may help with constipation problems. Drinking enough fluids to keep your urine clear or pale yellow may help as well.  Try to have someone home with you for 1 or 2 weeks to help around the house.  Keep all of your follow-up appointments as directed by your caregiver. SEEK MEDICAL CARE IF:   You have swelling, redness, or increasing pain in the surgical cut (incision) area.  You have pus coming from the incision.  You notice a bad smell coming from the incision or dressing.  You have swelling,  redness, or pain around the intravenous (IV) site.  Your incision breaks open.  You feel dizzy or lightheaded.  You have pain or bleeding when you urinate.  You have persistent diarrhea.  You have persistent nausea and vomiting.  You have abnormal vaginal discharge.  You have a rash.  You have any type of abnormal reaction or develop an allergy to your medicine.  Your pain is not controlled with your prescribed medicine. SEEK IMMEDIATE MEDICAL CARE IF:   You have a fever.  You have severe abdominal pain.  You have chest pain.  You have shortness of breath.  You faint.  You have pain, swelling, or redness of your leg.  You have heavy vaginal bleeding with blood clots. MAKE SURE YOU:  Understand these instructions.  Will watch your condition.  Will get help right away if you are not doing well or get worse. Document Released: 10/10/2004 Document Revised: 06/15/2011 Document Reviewed: 11/07/2010 ExitCare Patient Information 2013 [employer]. Hysterectomy Information  A hysterectomy is a procedure where your uterus is surgically removed. It will no longer be possible to have menstrual periods or to become pregnant. The tubes and ovaries can be removed (bilateral salpingo-oopherectomy) during this surgery as well.  REASONS FOR A HYSTERECTOMY  Persistent, abnormal bleeding.  Lasting (chronic) pelvic pain or infection.  The lining of the uterus (endometrium) starts growing outside the uterus (endometriosis).  The endometrium starts growing in the muscle of the uterus (adenomyosis).  The uterus falls down into the vagina (pelvic organ prolapse).  Symptomatic uterine fibroids.  Precancerous cells.  Cervical cancer or   uterine cancer. TYPES OF HYSTERECTOMIES  Supracervical hysterectomy. This type removes the top part of the uterus, but not the cervix.  Total hysterectomy. This type removes the uterus and cervix.  Radical hysterectomy. This type removes the  uterus, cervix, and the fibrous tissue that holds the uterus in place in the pelvis (parametrium). WAYS A HYSTERECTOMY CAN BE PERFORMED  Abdominal hysterectomy. A large surgical cut (incision) is made in the abdomen. The uterus is removed through this incision.  Vaginal hysterectomy. An incision is made in the vagina. The uterus is removed through this incision. There are no abdominal incisions.  Conventional laparoscopic hysterectomy. A thin, lighted tube with a camera (laparoscope) is inserted into 3 or 4 small incisions in the abdomen. The uterus is cut into small pieces. The small pieces are removed through the incisions, or they are removed through the vagina.  Laparoscopic assisted vaginal hysterectomy (LAVH). Three or four small incisions are made in the abdomen. Part of the surgery is performed laparoscopically and part vaginally. The uterus is removed through the vagina.  Robot-assisted laparoscopic hysterectomy. A laparoscope is inserted into 3 or 4 small incisions in the abdomen. A computer-controlled device is used to give the surgeon a 3D image. This allows for more precise movements of surgical instruments. The uterus is cut into small pieces and removed through the incisions or removed through the vagina. RISKS OF HYSTERECTOMY   Bleeding and risk of blood transfusion. Tell your caregiver if you do not want to receive any blood products.  Blood clots in the legs or lung.  Infection.  Injury to surrounding organs.  Anesthesia problems or side effects.  Conversion to an abdominal hysterectomy. WHAT TO EXPECT AFTER A HYSTERECTOMY  You will be given pain medicine.  You will need to have someone with you for the first 3 to 5 days after you go home.  You will need to follow up with your surgeon in 2 to 4 weeks after surgery to evaluate your progress.  You may have early menopause symptoms like hot flashes, night sweats, and insomnia.  If you had a hysterectomy for a problem  that was not a cancer or a condition that could lead to cancer, then you no longer need Pap tests. However, even if you no longer need a Pap test, a regular exam is a good idea to make sure no other problems are starting. Document Released: 09/16/2000 Document Revised: 06/15/2011 Document Reviewed: 11/01/2010 ExitCare Patient Information 2013 [employer].  

## 2012-04-27 ENCOUNTER — Encounter (HOSPITAL_COMMUNITY): Payer: Self-pay | Admitting: Pharmacist

## 2012-05-02 ENCOUNTER — Encounter (HOSPITAL_COMMUNITY)
Admission: RE | Admit: 2012-05-02 | Discharge: 2012-05-02 | Disposition: A | Discharge status: Home / Self Care | Payer: Medicare Other | Source: Ambulatory Visit | Attending: Obstetrics & Gynecology | Admitting: Obstetrics & Gynecology

## 2012-05-02 ENCOUNTER — Encounter (HOSPITAL_COMMUNITY): Payer: Self-pay

## 2012-05-02 ENCOUNTER — Ambulatory Visit (INDEPENDENT_AMBULATORY_CARE_PROVIDER_SITE_OTHER): Payer: BC Managed Care – PPO | Admitting: Obstetrics and Gynecology

## 2012-05-02 VITALS — BP 132/81 | HR 71

## 2012-05-02 DIAGNOSIS — D259 Leiomyoma of uterus, unspecified: Secondary | ICD-10-CM

## 2012-05-02 DIAGNOSIS — D219 Benign neoplasm of connective and other soft tissue, unspecified: Secondary | ICD-10-CM

## 2012-05-02 HISTORY — DX: Nausea with vomiting, unspecified: R11.2

## 2012-05-02 HISTORY — DX: Other specified postprocedural states: Z98.890

## 2012-05-02 LAB — CBC
HCT: 32.2 % — ABNORMAL LOW (ref 36.0–46.0)
Hemoglobin: 9.2 g/dL — ABNORMAL LOW (ref 12.0–15.0)
MCH: 15.6 pg — ABNORMAL LOW (ref 26.0–34.0)
MCHC: 28.6 g/dL — ABNORMAL LOW (ref 30.0–36.0)

## 2012-05-02 LAB — BASIC METABOLIC PANEL
BUN: 12 mg/dL (ref 6–23)
GFR calc non Af Amer: 73 mL/min — ABNORMAL LOW (ref >90)
Glucose, Bld: 91 mg/dL (ref 70–99)
Potassium: 3.7 mEq/L (ref 3.5–5.1)

## 2012-05-02 MED ORDER — MEDROXYPROGESTERONE ACETATE 150 MG/ML IM SUSP
150.0000 mg | Freq: Once | INTRAMUSCULAR | Status: AC
  Administered 2012-05-02: 150 mg via INTRAMUSCULAR

## 2012-05-02 NOTE — Patient Instructions (Addendum)
20 Beverly Bryant  05/02/2012   Your procedure is scheduled on:  05/04/12  Enter through the Main Entrance of [facility] at 6 AM.  Pick up the phone at the desk and dial [phone].   Call this number if you have problems the morning of surgery: [phone]   Remember:   Do not eat food:After Midnight.  Do not drink clear liquids: After Midnight.  Take these medicines the morning of surgery with A SIP OF WATER: NA   Do not wear jewelry, make-up or nail polish.  Do not wear lotions, powders, or perfumes. You may wear deodorant.  Do not shave 48 hours prior to surgery.  Do not bring valuables to the hospital.  Contacts, dentures or bridgework may not be worn into surgery.  Leave suitcase in the car. After surgery it may be brought to your room.  For patients admitted to the hospital, checkout time is 11:00 AM the day of discharge.   Patients discharged the day of surgery will not be allowed to drive home.  Name and phone number of your driver: NA  Special Instructions: Shower using CHG 2 nights before surgery and the night before surgery.  If you shower the day of surgery use CHG.  Use special wash - you have one bottle of CHG for all showers.  You should use approximately 1/3 of the bottle for each shower.   Please read over the following fact sheets that you were given: MRSA Information

## 2012-05-04 ENCOUNTER — Encounter (HOSPITAL_COMMUNITY)
Admission: RE | Disposition: A | Discharge status: Home / Self Care | Payer: Self-pay | Source: Ambulatory Visit | Attending: Obstetrics & Gynecology

## 2012-05-04 ENCOUNTER — Inpatient Hospital Stay (HOSPITAL_COMMUNITY)
Admission: RE | Admit: 2012-05-04 | Discharge: 2012-05-06 | DRG: 742 | Disposition: A | Discharge status: Home / Self Care | Payer: Medicare Other | Source: Ambulatory Visit | Attending: Obstetrics & Gynecology | Admitting: Obstetrics & Gynecology

## 2012-05-04 ENCOUNTER — Encounter (HOSPITAL_COMMUNITY): Payer: Self-pay | Admitting: *Deleted

## 2012-05-04 ENCOUNTER — Encounter (HOSPITAL_COMMUNITY): Payer: Self-pay | Admitting: Anesthesiology

## 2012-05-04 ENCOUNTER — Inpatient Hospital Stay (HOSPITAL_COMMUNITY): Payer: Medicare Other | Admitting: Anesthesiology

## 2012-05-04 DIAGNOSIS — D251 Intramural leiomyoma of uterus: Secondary | ICD-10-CM | POA: Diagnosis not present

## 2012-05-04 DIAGNOSIS — N856 Intrauterine synechiae: Secondary | ICD-10-CM | POA: Diagnosis not present

## 2012-05-04 DIAGNOSIS — N925 Other specified irregular menstruation: Secondary | ICD-10-CM | POA: Diagnosis not present

## 2012-05-04 DIAGNOSIS — I10 Essential (primary) hypertension: Secondary | ICD-10-CM | POA: Diagnosis present

## 2012-05-04 DIAGNOSIS — D259 Leiomyoma of uterus, unspecified: Secondary | ICD-10-CM | POA: Diagnosis not present

## 2012-05-04 DIAGNOSIS — D62 Acute posthemorrhagic anemia: Secondary | ICD-10-CM | POA: Diagnosis not present

## 2012-05-04 DIAGNOSIS — D250 Submucous leiomyoma of uterus: Secondary | ICD-10-CM | POA: Diagnosis not present

## 2012-05-04 DIAGNOSIS — N920 Excessive and frequent menstruation with regular cycle: Secondary | ICD-10-CM | POA: Diagnosis not present

## 2012-05-04 DIAGNOSIS — D219 Benign neoplasm of connective and other soft tissue, unspecified: Secondary | ICD-10-CM

## 2012-05-04 DIAGNOSIS — D649 Anemia, unspecified: Secondary | ICD-10-CM | POA: Diagnosis not present

## 2012-05-04 DIAGNOSIS — N949 Unspecified condition associated with female genital organs and menstrual cycle: Secondary | ICD-10-CM | POA: Diagnosis not present

## 2012-05-04 HISTORY — PX: ABDOMINAL HYSTERECTOMY: SHX81

## 2012-05-04 HISTORY — PX: BILATERAL SALPINGECTOMY: SHX5743

## 2012-05-04 LAB — PREPARE RBC (CROSSMATCH)

## 2012-05-04 LAB — PREGNANCY, URINE: Preg Test, Ur: NEGATIVE

## 2012-05-04 SURGERY — HYSTERECTOMY, ABDOMINAL
Anesthesia: General | Wound class: Clean Contaminated

## 2012-05-04 MED ORDER — 0.9 % SODIUM CHLORIDE (POUR BTL) OPTIME
TOPICAL | Status: DC | PRN
  Administered 2012-05-04 (×2): 1000 mL

## 2012-05-04 MED ORDER — ONDANSETRON HCL 4 MG/2ML IJ SOLN: 4.0000 mg | Freq: Four times a day (QID) | INTRAMUSCULAR | Status: DC | PRN

## 2012-05-04 MED ORDER — HYDROMORPHONE HCL PF 1 MG/ML IJ SOLN
INTRAMUSCULAR | Status: AC
  Filled 2012-05-04: qty 1

## 2012-05-04 MED ORDER — ONDANSETRON HCL 4 MG PO TABS: 4.0000 mg | ORAL_TABLET | Freq: Four times a day (QID) | ORAL | Status: DC | PRN

## 2012-05-04 MED ORDER — KETOROLAC TROMETHAMINE 30 MG/ML IJ SOLN
INTRAMUSCULAR | Status: DC | PRN
  Administered 2012-05-04: 60 mg via INTRAVENOUS

## 2012-05-04 MED ORDER — MORPHINE SULFATE (PF) 1 MG/ML IV SOLN
INTRAVENOUS | Status: DC
  Administered 2012-05-04: 13.5 mg via INTRAVENOUS
  Administered 2012-05-04: 11:00:00 via INTRAVENOUS
  Administered 2012-05-04: 14.93 mg via INTRAVENOUS
  Administered 2012-05-04: 17 mg via INTRAVENOUS
  Administered 2012-05-05: 4.5 mg via INTRAVENOUS
  Administered 2012-05-05: 1.5 mg via INTRAVENOUS
  Administered 2012-05-05: 6 mg via INTRAVENOUS
  Filled 2012-05-04 (×2): qty 25

## 2012-05-04 MED ORDER — HYDROMORPHONE HCL PF 1 MG/ML IJ SOLN
INTRAMUSCULAR | Status: AC
  Administered 2012-05-04: 0.5 mg via INTRAVENOUS
  Filled 2012-05-04: qty 1

## 2012-05-04 MED ORDER — SCOPOLAMINE 1 MG/3DAYS TD PT72
MEDICATED_PATCH | TRANSDERMAL | Status: AC
  Filled 2012-05-04: qty 1

## 2012-05-04 MED ORDER — ROCURONIUM BROMIDE 50 MG/5ML IV SOLN
INTRAVENOUS | Status: AC
  Filled 2012-05-04: qty 1

## 2012-05-04 MED ORDER — BUPIVACAINE HCL (PF) 0.25 % IJ SOLN
INTRAMUSCULAR | Status: AC
  Filled 2012-05-04: qty 30

## 2012-05-04 MED ORDER — DIPHENHYDRAMINE HCL 50 MG/ML IJ SOLN: 12.5000 mg | Freq: Four times a day (QID) | INTRAMUSCULAR | Status: DC | PRN

## 2012-05-04 MED ORDER — ONDANSETRON HCL 4 MG/2ML IJ SOLN
INTRAMUSCULAR | Status: DC | PRN
  Administered 2012-05-04: 4 mg via INTRAVENOUS

## 2012-05-04 MED ORDER — HYDROMORPHONE HCL PF 1 MG/ML IJ SOLN
INTRAMUSCULAR | Status: DC | PRN
  Administered 2012-05-04: 1 mg via INTRAVENOUS
  Administered 2012-05-04 (×2): 0.5 mg via INTRAVENOUS

## 2012-05-04 MED ORDER — NEOSTIGMINE METHYLSULFATE 1 MG/ML IJ SOLN
INTRAMUSCULAR | Status: DC | PRN
  Administered 2012-05-04: 3 mg via INTRAVENOUS

## 2012-05-04 MED ORDER — DEXAMETHASONE SODIUM PHOSPHATE 10 MG/ML IJ SOLN
INTRAMUSCULAR | Status: AC
  Filled 2012-05-04: qty 1

## 2012-05-04 MED ORDER — MENTHOL 3 MG MT LOZG: 1.0000 | LOZENGE | OROMUCOSAL | Status: DC | PRN

## 2012-05-04 MED ORDER — NALOXONE HCL 0.4 MG/ML IJ SOLN: 0.4000 mg | INTRAMUSCULAR | Status: DC | PRN

## 2012-05-04 MED ORDER — KETOROLAC TROMETHAMINE 30 MG/ML IJ SOLN: 30.0000 mg | Freq: Four times a day (QID) | INTRAMUSCULAR | Status: DC

## 2012-05-04 MED ORDER — SODIUM CHLORIDE 0.9 % IJ SOLN: 9.0000 mL | INTRAMUSCULAR | Status: DC | PRN

## 2012-05-04 MED ORDER — DEXTROSE-NACL 5-0.45 % IV SOLN
INTRAVENOUS | Status: DC
  Administered 2012-05-04 – 2012-05-05 (×2): via INTRAVENOUS

## 2012-05-04 MED ORDER — LACTATED RINGERS IV SOLN
INTRAVENOUS | Status: DC
  Administered 2012-05-04 (×6): via INTRAVENOUS

## 2012-05-04 MED ORDER — MORPHINE SULFATE 4 MG/ML IJ SOLN: 1.0000 mg | INTRAMUSCULAR | Status: DC | PRN

## 2012-05-04 MED ORDER — FENTANYL CITRATE 0.05 MG/ML IJ SOLN
INTRAMUSCULAR | Status: AC
  Filled 2012-05-04: qty 5

## 2012-05-04 MED ORDER — CEFAZOLIN SODIUM-DEXTROSE 2-3 GM-% IV SOLR
INTRAVENOUS | Status: AC
  Filled 2012-05-04: qty 50

## 2012-05-04 MED ORDER — HYDROCODONE-ACETAMINOPHEN 5-325 MG PO TABS
1.0000 | ORAL_TABLET | ORAL | Status: DC | PRN
  Administered 2012-05-05 – 2012-05-06 (×4): 2 via ORAL
  Filled 2012-05-04 (×4): qty 2

## 2012-05-04 MED ORDER — LIDOCAINE HCL (CARDIAC) 20 MG/ML IV SOLN
INTRAVENOUS | Status: AC
  Filled 2012-05-04: qty 5

## 2012-05-04 MED ORDER — KETOROLAC TROMETHAMINE 30 MG/ML IJ SOLN: 15.0000 mg | Freq: Once | INTRAMUSCULAR | Status: DC | PRN

## 2012-05-04 MED ORDER — MIDAZOLAM HCL 5 MG/5ML IJ SOLN
INTRAMUSCULAR | Status: DC | PRN
  Administered 2012-05-04: 2 mg via INTRAVENOUS

## 2012-05-04 MED ORDER — NEOSTIGMINE METHYLSULFATE 1 MG/ML IJ SOLN
INTRAMUSCULAR | Status: AC
  Filled 2012-05-04: qty 1

## 2012-05-04 MED ORDER — FENTANYL CITRATE 0.05 MG/ML IJ SOLN
INTRAMUSCULAR | Status: DC | PRN
  Administered 2012-05-04 (×2): 50 ug via INTRAVENOUS
  Administered 2012-05-04: 100 ug via INTRAVENOUS
  Administered 2012-05-04: 50 ug via INTRAVENOUS

## 2012-05-04 MED ORDER — PROPOFOL 10 MG/ML IV EMUL
INTRAVENOUS | Status: DC | PRN
  Administered 2012-05-04: 200 mg via INTRAVENOUS

## 2012-05-04 MED ORDER — SCOPOLAMINE 1 MG/3DAYS TD PT72
1.0000 | MEDICATED_PATCH | TRANSDERMAL | Status: DC
  Administered 2012-05-04: 1.5 mg via TRANSDERMAL

## 2012-05-04 MED ORDER — ONDANSETRON HCL 4 MG/2ML IJ SOLN
INTRAMUSCULAR | Status: AC
  Filled 2012-05-04: qty 2

## 2012-05-04 MED ORDER — LIDOCAINE HCL (CARDIAC) 20 MG/ML IV SOLN
INTRAVENOUS | Status: DC | PRN
  Administered 2012-05-04: 80 mg via INTRAVENOUS

## 2012-05-04 MED ORDER — LACTATED RINGERS IV SOLN
INTRAVENOUS | Status: DC
  Administered 2012-05-04: 07:00:00 via INTRAVENOUS

## 2012-05-04 MED ORDER — PROPOFOL 10 MG/ML IV EMUL
INTRAVENOUS | Status: AC
  Filled 2012-05-04: qty 20

## 2012-05-04 MED ORDER — FLEET ENEMA 7-19 GM/118ML RE ENEM: 1.0000 | ENEMA | Freq: Once | RECTAL | Status: AC | PRN

## 2012-05-04 MED ORDER — DIPHENHYDRAMINE HCL 12.5 MG/5ML PO ELIX: 12.5000 mg | ORAL_SOLUTION | Freq: Four times a day (QID) | ORAL | Status: DC | PRN

## 2012-05-04 MED ORDER — ROCURONIUM BROMIDE 100 MG/10ML IV SOLN
INTRAVENOUS | Status: DC | PRN
  Administered 2012-05-04: 45 mg via INTRAVENOUS
  Administered 2012-05-04 (×4): 5 mg via INTRAVENOUS

## 2012-05-04 MED ORDER — KETOROLAC TROMETHAMINE 30 MG/ML IJ SOLN
INTRAMUSCULAR | Status: AC
  Filled 2012-05-04: qty 2

## 2012-05-04 MED ORDER — BUPIVACAINE HCL (PF) 0.25 % IJ SOLN
INTRAMUSCULAR | Status: DC | PRN
  Administered 2012-05-04: 30 mL

## 2012-05-04 MED ORDER — PANTOPRAZOLE SODIUM 40 MG IV SOLR
40.0000 mg | Freq: Every day | INTRAVENOUS | Status: DC
  Administered 2012-05-04: 40 mg via INTRAVENOUS
  Filled 2012-05-04 (×2): qty 40

## 2012-05-04 MED ORDER — DOCUSATE SODIUM 100 MG PO CAPS
100.0000 mg | ORAL_CAPSULE | Freq: Two times a day (BID) | ORAL | Status: DC
  Administered 2012-05-05 – 2012-05-06 (×3): 100 mg via ORAL
  Filled 2012-05-04 (×3): qty 1

## 2012-05-04 MED ORDER — MIDAZOLAM HCL 2 MG/2ML IJ SOLN
INTRAMUSCULAR | Status: AC
  Filled 2012-05-04: qty 2

## 2012-05-04 MED ORDER — METOCLOPRAMIDE HCL 5 MG/ML IJ SOLN: 10.0000 mg | Freq: Once | INTRAMUSCULAR | Status: DC | PRN

## 2012-05-04 MED ORDER — ALUM & MAG HYDROXIDE-SIMETH 200-200-20 MG/5ML PO SUSP: 30.0000 mL | ORAL | Status: DC | PRN

## 2012-05-04 MED ORDER — GLYCOPYRROLATE 0.2 MG/ML IJ SOLN
INTRAMUSCULAR | Status: AC
  Filled 2012-05-04: qty 3

## 2012-05-04 MED ORDER — GLYCOPYRROLATE 0.2 MG/ML IJ SOLN
INTRAMUSCULAR | Status: DC | PRN
  Administered 2012-05-04: 0.6 mg via INTRAVENOUS
  Administered 2012-05-04: 0.1 mg via INTRAVENOUS

## 2012-05-04 MED ORDER — POLYETHYLENE GLYCOL 3350 17 G PO PACK
17.0000 g | PACK | Freq: Every day | ORAL | Status: DC
  Filled 2012-05-04: qty 1

## 2012-05-04 MED ORDER — CEFAZOLIN SODIUM-DEXTROSE 2-3 GM-% IV SOLR
2.0000 g | INTRAVENOUS | Status: AC
  Administered 2012-05-04: 2 g via INTRAVENOUS

## 2012-05-04 MED ORDER — POLYETHYLENE GLYCOL 3350 17 G PO PACK
17.0000 g | PACK | Freq: Every day | ORAL | Status: DC | PRN
Start: 2012-05-04 — End: 2012-05-06
  Administered 2012-05-06: 17 g via ORAL
  Filled 2012-05-04: qty 1

## 2012-05-04 MED ORDER — DEXAMETHASONE SODIUM PHOSPHATE 4 MG/ML IJ SOLN
INTRAMUSCULAR | Status: DC | PRN
  Administered 2012-05-04: 10 mg via INTRAVENOUS

## 2012-05-04 MED ORDER — HYDROMORPHONE HCL PF 1 MG/ML IJ SOLN
0.2500 mg | INTRAMUSCULAR | Status: DC | PRN
  Administered 2012-05-04: 0.5 mg via INTRAVENOUS

## 2012-05-04 SURGICAL SUPPLY — 47 items
BARRIER ADHS 3X4 INTERCEED (GAUZE/BANDAGES/DRESSINGS) IMPLANT
BENZOIN TINCTURE PRP APPL 2/3 (GAUZE/BANDAGES/DRESSINGS) IMPLANT
CANISTER SUCTION 2500CC (MISCELLANEOUS) ×2 IMPLANT
CELLS DAT CNTRL 66122 CELL SVR (MISCELLANEOUS) IMPLANT
CHLORAPREP W/TINT 26ML (MISCELLANEOUS) ×2 IMPLANT
CLOTH BEACON ORANGE TIMEOUT ST (SAFETY) ×2 IMPLANT
CONT PATH 16OZ SNAP LID 3702 (MISCELLANEOUS) ×2 IMPLANT
DECANTER SPIKE VIAL GLASS SM (MISCELLANEOUS) IMPLANT
DRESSING TELFA 8X3 (GAUZE/BANDAGES/DRESSINGS) ×2 IMPLANT
GAUZE SPONGE 4X4 16PLY XRAY LF (GAUZE/BANDAGES/DRESSINGS) ×4 IMPLANT
GLOVE BIO SURGEON STRL SZ7 (GLOVE) ×4 IMPLANT
GLOVE BIOGEL PI IND STRL 7.0 (GLOVE) ×1 IMPLANT
GLOVE BIOGEL PI INDICATOR 7.0 (GLOVE) ×1
GOWN STRL REIN XL XLG (GOWN DISPOSABLE) ×6 IMPLANT
NEEDLE HYPO 25X1 1.5 SAFETY (NEEDLE) ×2 IMPLANT
NS IRRIG 1000ML POUR BTL (IV SOLUTION) ×2 IMPLANT
PACK ABDOMINAL GYN (CUSTOM PROCEDURE TRAY) ×2 IMPLANT
PAD ABD 7.5X8 STRL (GAUZE/BANDAGES/DRESSINGS) ×4 IMPLANT
PAD OB MATERNITY 4.3X12.25 (PERSONAL CARE ITEMS) ×2 IMPLANT
PROTECTOR NERVE ULNAR (MISCELLANEOUS) ×2 IMPLANT
RETRACTOR WND ALEXIS 25 LRG (MISCELLANEOUS) IMPLANT
RTRCTR WOUND ALEXIS 18CM MED (MISCELLANEOUS)
RTRCTR WOUND ALEXIS 25CM LRG (MISCELLANEOUS)
SPONGE GAUZE 4X4 12PLY (GAUZE/BANDAGES/DRESSINGS) ×2 IMPLANT
SPONGE LAP 18X18 X RAY DECT (DISPOSABLE) ×4 IMPLANT
SPONGE SURGIFOAM ABS GEL 12-7 (HEMOSTASIS) ×2 IMPLANT
STAPLER VISISTAT 35W (STAPLE) ×2 IMPLANT
STRIP CLOSURE SKIN 1/2X4 (GAUZE/BANDAGES/DRESSINGS) IMPLANT
SUT VIC AB 0 CT1 18XCR BRD8 (SUTURE) ×4 IMPLANT
SUT VIC AB 0 CT1 27 (SUTURE) ×3
SUT VIC AB 0 CT1 27XBRD ANBCTR (SUTURE) ×2 IMPLANT
SUT VIC AB 0 CT1 27XCR 8 STRN (SUTURE) ×1 IMPLANT
SUT VIC AB 0 CT1 8-18 (SUTURE) ×4
SUT VIC AB 0 CTX 36 (SUTURE) ×2
SUT VIC AB 0 CTX36XBRD ANBCTRL (SUTURE) ×2 IMPLANT
SUT VIC AB 3-0 CT1 27 (SUTURE) ×1
SUT VIC AB 3-0 CT1 TAPERPNT 27 (SUTURE) ×1 IMPLANT
SUT VIC AB 3-0 SH 27 (SUTURE)
SUT VIC AB 3-0 SH 27X BRD (SUTURE) IMPLANT
SUT VIC AB 4-0 KS 27 (SUTURE) IMPLANT
SUT VICRYL 0 TIES 12 18 (SUTURE) ×2 IMPLANT
SYR CONTROL 10ML LL (SYRINGE) ×2 IMPLANT
TOWEL OR 17X24 6PK STRL BLUE (TOWEL DISPOSABLE) ×4 IMPLANT
TRAY FOLEY CATH 14FR (SET/KITS/TRAYS/PACK) ×2 IMPLANT
TUBING CONNECTING 10 (TUBING) ×2 IMPLANT
WATER STERILE IRR 1000ML POUR (IV SOLUTION) ×2 IMPLANT
YANKAUER SUCT BULB TIP NO VENT (SUCTIONS) ×2 IMPLANT

## 2012-05-04 NOTE — Anesthesia Postprocedure Evaluation (Signed)
  Anesthesia Post-op Note  Patient: Beverly Bryant  Procedure(s) Performed: Procedure(s) (LRB) with comments: HYSTERECTOMY ABDOMINAL (N/A) BILATERAL SALPINGECTOMY (N/A)  Patient Location: PACU  Anesthesia Type:General  Level of Consciousness: awake, alert  and oriented  Airway and Oxygen Therapy: Patient Spontanous Breathing  Post-op Pain: mild  Post-op Assessment: Post-op Vital signs reviewed, Patient's Cardiovascular Status Stable, Respiratory Function Stable, Patent Airway, No signs of Nausea or vomiting and Pain level controlled  Post-op Vital Signs: Reviewed and stable  Complications: No apparent anesthesia complications

## 2012-05-04 NOTE — Anesthesia Preprocedure Evaluation (Signed)
Anesthesia Evaluation  Patient identified by MRN, date of birth, ID band Patient awake    Reviewed: Allergy & Precautions, H&P , NPO status , Patient's Chart, lab work & pertinent test results, reviewed documented beta blocker date and time   History of Anesthesia Complications (+) PONV  Airway Mallampati: III TM Distance: >3 FB Neck ROM: full    Dental  (+) Teeth Intact   Pulmonary neg pulmonary ROS, former smoker (quit 24 years ago),  breath sounds clear to auscultation  Pulmonary exam normal       Cardiovascular Exercise Tolerance: Good hypertension (took BP med last night, BP 151/89), On Home Beta Blockers Rhythm:regular Rate:Normal     Neuro/Psych negative neurological ROS  negative psych ROS   GI/Hepatic negative GI ROS, Neg liver ROS,   Endo/Other  Morbid obesity  Renal/GU negative Renal ROS  Female GU complaint     Musculoskeletal   Abdominal   Peds  Hematology  (+) anemia ,   Anesthesia Other Findings   Reproductive/Obstetrics negative OB ROS                           Anesthesia Physical Anesthesia Plan  ASA: III  Anesthesia Plan: General ETT   Post-op Pain Management:    Induction:   Airway Management Planned:   Additional Equipment:   Intra-op Plan:   Post-operative Plan:   Informed Consent: I have reviewed the patients History and Physical, chart, labs and discussed the procedure including the risks, benefits and alternatives for the proposed anesthesia with the patient or authorized representative who has indicated his/her understanding and acceptance.   Dental Advisory Given  Plan Discussed with: CRNA and Surgeon  Anesthesia Plan Comments:         Anesthesia Quick Evaluation

## 2012-05-04 NOTE — Preoperative (Addendum)
Beta Blockers   Reason not to administer Beta Blockers:Patient took medication at home as pprescribed

## 2012-05-04 NOTE — Brief Op Note (Signed)
05/04/2012  10:04 AM  PATIENT:  Beverly Bryant  52 y.o. female  PRE-OPERATIVE DIAGNOSIS:  Dysfunctional Uterine Bleeding Secondary to Fibroid Uterus  POST-OPERATIVE DIAGNOSIS:  Dysfunctional Uterine Bleeding Secondary to Fibroid Uterus  PROCEDURE:  Procedure(s) (LRB) with comments: HYSTERECTOMY ABDOMINAL (N/A) BILATERAL SALPINGECTOMY (N/A)  SURGEON:  Surgeon(s) and Role:    * Willodean Rosenthal, MD - Primary    * Catalina Antigua, MD - Assisting  ANESTHESIA:   general  EBL:  Total I/O In: 3100 [I.V.:3100] Out: 1025 [Urine:125; Blood:900]  BLOOD ADMINISTERED:none  DRAINS: none   LOCAL MEDICATIONS USED:  MARCAINE     SPECIMEN:  Source of Specimen:  uterus, cervix and fallopian tubes   DISPOSITION OF SPECIMEN:  PATHOLOGY  COUNTS:  YES  TOURNIQUET:  * No tourniquets in log *  DICTATION: .Note written in EPIC  PLAN OF CARE: Admit to inpatient   PATIENT DISPOSITION:  PACU - hemodynamically stable.   Delay start of Pharmacological VTE agent (>24hrs) due to surgical blood loss or risk of bleeding: yes

## 2012-05-04 NOTE — Progress Notes (Signed)
Ur chart review completed.  

## 2012-05-04 NOTE — Transfer of Care (Signed)
Immediate Anesthesia Transfer of Care Note  Patient: Beverly Bryant  Procedure(s) Performed: Procedure(s) (LRB) with comments: HYSTERECTOMY ABDOMINAL (N/A) BILATERAL SALPINGECTOMY (N/A)  Patient Location: PACU  Anesthesia Type:General  Level of Consciousness: awake, alert , oriented and patient cooperative  Airway & Oxygen Therapy: Patient Spontanous Breathing and Patient connected to nasal cannula oxygen  Post-op Assessment: Report given to PACU RN and Post -op Vital signs reviewed and stable  Post vital signs: Reviewed and stable  Complications: No apparent anesthesia complications

## 2012-05-04 NOTE — H&P (Signed)
  Chief Complaint: Vaginal bleeding, dizziness  HPI: 52 y.o.G0 with long standing h/o fibroid uterus. Previous myomectomy x 2. No heavy bleeding since last admit.  S/p transfusion of 3 units of PRBC's in Nov. Presents for definitive management of uterine fibroids. Past Medical History   Diagnosis  Date   .  Fibroids    .  Hypertension    .  Sinusitis     Past Surgical History   Procedure  Date   .  Uterine fibroid surgery    History reviewed. No pertinent family history.  Social History: reports that she has quit smoking. She does not have any smokeless tobacco history on file. She reports that she does not drink alcohol or use illicit drugs.  Allergies: No Known Allergies  Medications Prior to Admission   Medication  Sig  Dispense  Refill   .  mometasone (NASONEX) 50 MCG/ACT nasal spray  Place 2 sprays into the nose 2 (two) times daily.     .  propranolol (INNOPRAN XL) 80 MG 24 hr capsule  Take 80 mg by mouth at bedtime.     ROS  Constitutional: negative for chills and fevers  Respiratory: positive for dyspnea on exertion  Cardiovascular: positive for dyspnea and fatigue  Genitourinary:positive for heavy vaginal bleeding  Otherwise negative  Blood pressure BP 151/89  Pulse 73  Temp 97.5 F (36.4 C) (Oral)  Resp 18  Ht 5\' 8"  (1.727 m)  Wt 250 lb (113.399 kg)  BMI 38.01 kg/m2  SpO2 100%   General appearance: alert, cooperative and appears stated age  Head: Normocephalic, without obvious abnormality, atraumatic  Neck: supple, symmetrical, trachea midline  Lungs: normal effort  Heart: regular rate and rhythm  Abdomen: normal findings: soft, non-tender and abnormal findings: mass, located in the lower abdomen. Large ventral hernia  GYN: not repeat today Extremities: extremities normal, atraumatic, no cyanosis or edema  Skin: Skin color, texture, turgor normal. No rashes or lesions  Lab Results   Component  Value  Date    WBC  8.0  02/18/2012    HGB  5.4*  02/18/2012    HCT   20.4*  02/18/2012    MCV  50.9*  02/18/2012      PLT  324  02/18/2012    CBC    Component Value Date/Time   WBC 6.2 05/02/2012 1600   RBC 5.90* 05/02/2012 1600   HGB 9.2* 05/02/2012 1600   HCT 32.2* 05/02/2012 1600   PLT 309 05/02/2012 1600   MCV 54.6* 05/02/2012 1600   MCH 15.6* 05/02/2012 1600   MCHC 28.6* 05/02/2012 1600   RDW 25.6* 05/02/2012 1600   LYMPHSABS 1.6 02/18/2012 2047   MONOABS 0.6 02/18/2012 2047   EOSABS 0.1 02/18/2012 2047   BASOSABS 0.1 02/18/2012 2047     No results found for this basename: PREGTESTUR, PREGSERUM, HCG, HCGQUANT   Assessment/Plan  Patient Active Problem List   Diagnosis   .  Menorrhagia   .  Acute blood loss anemia   .  Fibroids   For total abdominal hyst with bilateral salpingectomy  Eliam Snapp L. Harraway-Smith, M.D., [place]

## 2012-05-04 NOTE — Op Note (Signed)
Pre-procedure Diagnoses   1. Pelvic pain in female [625.9]   2. Fibroids [218.9]       Post-procedure Diagnoses   1. Female pelvic pain [625.9]   2. Fibroids [218.9]       Procedures   1. TOTAL ABDOMINAL HYSTERECTOMY W/ BILATERAL SALPINGOOPHORECTOMY [SHX83]       Lynwood Dawley  PROCEDURE DATE: 05/04/2012  PREOPERATIVE DIAGNOSIS: Symptomatic fibroids, menorrhagia  POSTOPERATIVE DIAGNOSIS: Symptomatic fibroids, menorrhagia  SURGEON: Lynora Dymond L. Harraway-Smith, M.D.  ASSISTANT: Catalina Antigua, M.D.  OPERATION: Total abdominal hysterectomy, Bilateral Salpingectomy,  ANESTHESIA: General endotracheal.  INDICATIONS: The patient is a 52 y.o. G0 with the aforementioned diagnoses who desires definitive surgical management. On the preoperative visit, the risks, benefits, indications, and alternatives of the procedure were reviewed with the patient. On the day of surgery, the risks of surgery were again discussed with the patient including but not limited to: bleeding which may require transfusion or reoperation; infection which may require antibiotics; injury to bowel, bladder, ureters or other surrounding organs; need for additional procedures; thromboembolic phenomenon, incisional problems and other postoperative/anesthesia complications. Written informed consent was obtained.  OPERATIVE FINDINGS: A 16 week size uterus with normal tubes and ovaries bilaterally.  SPECIMENS: Uterus,cervix, bilateral fallopian tubes sent to pathology  COMPLICATIONS: none.  DESCRIPTION OF PROCEDURE: The patient received intravenous antibiotics and had sequential compression devices applied to her lower extremities while in the preoperative area. She was taken to the operating room and placed under general anesthesia without difficulty. The abdomen and perineum were prepped and draped in a sterile manner, and she was placed in a dorsal supine position. A Foley catheter was inserted into the bladder and  attached to constant drainage. After an adequate timeout was performed, a transverse skin incision was made. This incision was taken down to the fascia using a scalpel and cautery with care given to maintain good hemostasis. The fascia was grasped with kocher clamps, tented up and the rectus muscles were dissected off using sharp and blunt dissection on the superior and inferior aspects of the fascial incision. The peritoneum entered sharply without complication. This peritoneal cavity was entered digitally. Attention was then turned to the pelvis. The uterus at this point was noted to be mobilized and was delivered up out of the abdomen. The bowel was packed away with moist laparotomy sponges. The round ligaments on each side were clamped, suture ligated with 0 Vicryl, and transected with electrocautery allowing entry into the broad ligament. Of note, all sutures used in this procedure are 0 Vicryl unless otherwise noted. The anterior and posterior leaves of the broad ligament were separated, and the ureters were inspected to be safely away from the area of dissection bilaterally. The infundibulopelvic ligaments were bliaterally clamped and doubly suture ligated. The uterine arteries were skeletinized bilaterally A bladder flap was then created. The bladder was then bluntly dissected off the lower uterine segment and cervix with good hemostasis noted. The uterine arteries were then skeletonized bilaterally and then clamped, cut, and doubly suture ligated with care given to prevent ureteral injury. After the uterine arteries were clamped, the uterus was removed with a scalpel and the cervical stump was grasped with a Jacob's tenaculum. The uterosacral ligaments were then clamped, cut, and ligated bilaterally. Finally, the cardinal ligaments were clamped, cut, and ligated bilaterally. Using [employer], the uterus was removed.  The cuff angles were closed with Heaney stiches with care given to incorporate the  uterosacral-cardinal ligament pedicles on both sides. The middle of the  vaginal cuff was closed with a series of interrupted figure-of-eight sutures with care given to incorporate the anterior pubocervical fascia and the posterior rectovaginal fascia. The pelvis was irrigated and hemostasis was reconfirmed at all pedicles and along the pelvic sidewall. The ureters were inspected and noted to be peristalsing bilaterally. All laparotomy sponges and instruments were removed from the abdomen. The fascia, rectus muscles and peritoneum were closed in a mass fashion using an 0 Vicryl suture in a running fashion. The skin was closed with staples. Sponge, lap, needle, and instrument counts were correct times two. The patient was taken to the recovery area awake, extubated and in stable condition.

## 2012-05-05 ENCOUNTER — Encounter (HOSPITAL_COMMUNITY): Payer: Self-pay | Admitting: Obstetrics & Gynecology

## 2012-05-05 LAB — CBC
Hemoglobin: 6.6 g/dL — CL (ref 12.0–15.0)
RBC: 4.32 MIL/uL (ref 3.87–5.11)
WBC: 10 10*3/uL (ref 4.0–10.5)

## 2012-05-05 LAB — BASIC METABOLIC PANEL
CO2: 24 mEq/L (ref 19–32)
Glucose, Bld: 132 mg/dL — ABNORMAL HIGH (ref 70–99)
Potassium: 4 mEq/L (ref 3.5–5.1)
Sodium: 137 mEq/L (ref 135–145)

## 2012-05-05 MED ORDER — PROPRANOLOL HCL ER BEADS 80 MG PO CP24: 80.0000 mg | ORAL_CAPSULE | Freq: Every day | ORAL | Status: DC

## 2012-05-05 MED ORDER — PROPRANOLOL HCL ER 80 MG PO CP24
80.0000 mg | ORAL_CAPSULE | Freq: Every day | ORAL | Status: DC
  Administered 2012-05-05: 80 mg via ORAL
  Filled 2012-05-05: qty 1

## 2012-05-05 MED ORDER — FLUTICASONE PROPIONATE 50 MCG/ACT NA SUSP
2.0000 | Freq: Every day | NASAL | Status: DC
  Administered 2012-05-05 – 2012-05-06 (×2): 2 via NASAL
  Filled 2012-05-05: qty 16

## 2012-05-05 MED ORDER — PANTOPRAZOLE SODIUM 40 MG PO TBEC
40.0000 mg | DELAYED_RELEASE_TABLET | Freq: Every day | ORAL | Status: DC
  Administered 2012-05-05 – 2012-05-06 (×2): 40 mg via ORAL
  Filled 2012-05-05 (×2): qty 1

## 2012-05-05 MED ORDER — LACTATED RINGERS IV BOLUS (SEPSIS)
500.0000 mL | Freq: Once | INTRAVENOUS | Status: AC
  Administered 2012-05-05: 500 mL via INTRAVENOUS

## 2012-05-05 MED ORDER — ZOLPIDEM TARTRATE 5 MG PO TABS
5.0000 mg | ORAL_TABLET | Freq: Every evening | ORAL | Status: DC | PRN
  Administered 2012-05-05: 5 mg via ORAL
  Filled 2012-05-05: qty 1

## 2012-05-05 NOTE — Progress Notes (Signed)
CRITICAL VALUE ALERT  Critical value received:  Hemoglobin 6.6  Date of notification: 05/05/2012  Time of notification:  0625  Critical value read back:yes  Nurse who received alert:  Selinda Michaels, RN  MD notified (1st page):  Emelda Fear   Time of first page:  0625  MD notified (2nd page):  Time of second page:  Responding MD:  Emelda Fear  Time MD responded:  [phone]  Dr. Emelda Fear notified of critical lab value. No new orders at this time.

## 2012-05-05 NOTE — Progress Notes (Signed)
1 Day Post-Op Procedure(s) (LRB): HYSTERECTOMY ABDOMINAL (N/A) BILATERAL SALPINGECTOMY (N/A)  Subjective: Patient reports tolerating PO and + flatus.  Adequate pain control but, feels 'sore'.  No n/v overnight.  Has ambulated without difficulty.  Pt denies dizziness or DOE.  No c/o SOB.  Objective: I have reviewed patient's vital signs, intake and output, medications and labs.  General: alert, cooperative and no distress Resp: clear to auscultation bilaterally Cardio: regular rate and rhythm, S1, S2 normal, no murmur, click, rub or gallop GI: soft, non-tender; bowel sounds normal; no masses,  no organomegaly and incision: dressing dry  Assessment: s/p Procedure(s) (LRB) with comments: HYSTERECTOMY ABDOMINAL (N/A) BILATERAL SALPINGECTOMY (N/A): stable, progressing well and anemia  Plan: Advance diet Encourage ambulation Discontinue IV fluids will start iron after discharge- no sx currently   LOS: 1 day  Restart home meds  Bryant, Beverly Tye 05/05/2012, 12:45 PM

## 2012-05-06 LAB — TYPE AND SCREEN
ABO/RH(D): A POS
Unit division: 0

## 2012-05-06 MED ORDER — DSS 100 MG PO CAPS: 100.0000 mg | ORAL_CAPSULE | Freq: Two times a day (BID) | ORAL | Status: DC | PRN

## 2012-05-06 MED ORDER — INTEGRA PLUS PO CAPS: ORAL_CAPSULE | ORAL | Status: AC

## 2012-05-06 MED ORDER — HYDROCODONE-ACETAMINOPHEN 5-325 MG PO TABS: 1.0000 | ORAL_TABLET | ORAL | Status: DC | PRN

## 2012-05-06 NOTE — Discharge Summary (Signed)
Physician Discharge Summary  Patient ID: Beverly Bryant MRN: [id number] DOB/AGE: August 01, 1960 52 y.o.  Admit date: 05/04/2012 Discharge date: 05/06/2012  Admission Diagnoses: Uterine fibroids, anemia, menorrhagia  Discharge Diagnoses: same as admission Active Problems:  * No active hospital problems. *    Discharged Condition: good  Hospital Course: Pt was admitted on 05/04/12 and underwent a TAH with bilateral salpingectomy.  Pt had a post op Hct of %23 but, was completely asymptomatic.  Pt started a po diet with no n/v/f/c.  She currently has no pain but feel pressure.  Has had no BM or flatus but, has tolerated 3 meals with no problems.     Consults: None  Significant Diagnostic Studies: labs: CBC and pathology  Treatments: IV hydration, analgesia: acetaminophen w/ codeine, Morphine and toradol and surgery: total abdominal hysterectomy with bilateral salpingectomy   Discharge Exam: Blood pressure 131/84, pulse 72, temperature 97.2 F (36.2 C), temperature source Oral, resp. rate 18, height 5\' 8"  (1.727 m), weight 250 lb (113.399 kg), SpO2 100.00%. General appearance: alert and no distress Resp: clear to auscultation bilaterally Cardio: regular rate and rhythm, S1, S2 normal, no murmur, click, rub or gallop GI: soft, non-tender; bowel sounds normal; no masses,  no organomegaly Incision: clean and dry.  Dressing removed.  Steristrips in place.  Disposition: 01-Home or Self Care  Discharge Orders    Future Appointments: Provider: Department: Dept Phone: Center:   05/25/2012 1:15 PM Willodean Rosenthal, MD [facility] [phone] [facility]     Future Orders Please Complete By Expires   Diet - low sodium heart healthy      Increase activity slowly      May shower / Bathe      Scheduling Instructions:   No TUB baths. Shower only.   Driving Restrictions      Comments:   No driving for 2 weeks   Lifting restrictions      Comments:   Please do not lift  anything over 10# for 6 weeks   Sexual Activity Restrictions      Comments:   NOTHING IN VAGINA FOR 6  weeks   Discharge wound care:      Comments:   Keep incision clean and dry.  Steri-strips may come off in 2 weeks or sooner if they fall off       Medication List     As of 05/06/2012  8:40 AM    STOP taking these medications         ferrous sulfate 325 (65 FE) MG tablet      medroxyPROGESTERone 10 MG tablet   Commonly known as: PROVERA      oxyCODONE-acetaminophen 5-325 MG per tablet   Commonly known as: PERCOCET/ROXICET      TAKE these medications         DSS 100 MG Caps   Take 100 mg by mouth 2 (two) times daily.      DSS 100 MG Caps   Take 100 mg by mouth 2 (two) times daily as needed for constipation.      HYDROcodone-acetaminophen 5-325 MG per tablet   Commonly known as: NORCO/VICODIN   Take 1-2 tablets by mouth every 4 (four) hours as needed.      ibuprofen 600 MG tablet   Commonly known as: ADVIL,MOTRIN   Take 1 tablet (600 mg total) by mouth every 6 (six) hours as needed (mild pain).      INTEGRA PLUS Caps   1 capsule  mometasone 50 MCG/ACT nasal spray   Commonly known as: NASONEX   Place 2 sprays into the nose 2 (two) times daily.      propranolol 80 MG 24 hr capsule   Commonly known as: INNOPRAN XL   Take 80 mg by mouth at bedtime.           Follow-up Information    Follow up with [facility], Kellen Hover, MD. Schedule an appointment as soon as possible for a visit in 2 weeks.   Contact information:   [address] [place] [address] [phone]        Pt with uncomplicated post op course.  Doing well  For d/c to home after passing flatus  Signed: HARRAWAY-SMITH, Nalina Yeatman 05/06/2012, 8:40 AM

## 2012-05-17 DIAGNOSIS — F339 Major depressive disorder, recurrent, unspecified: Secondary | ICD-10-CM | POA: Diagnosis not present

## 2012-05-25 ENCOUNTER — Encounter: Payer: Self-pay | Admitting: Obstetrics & Gynecology

## 2012-05-25 ENCOUNTER — Ambulatory Visit (INDEPENDENT_AMBULATORY_CARE_PROVIDER_SITE_OTHER): Payer: Medicare Other | Admitting: Obstetrics & Gynecology

## 2012-05-25 VITALS — BP 130/80 | HR 72 | Temp 97.9°F | Ht 68.0 in | Wt 239.0 lb

## 2012-05-25 DIAGNOSIS — Z87898 Personal history of other specified conditions: Secondary | ICD-10-CM

## 2012-05-25 DIAGNOSIS — Z9889 Other specified postprocedural states: Secondary | ICD-10-CM

## 2012-05-25 DIAGNOSIS — Z09 Encounter for follow-up examination after completed treatment for conditions other than malignant neoplasm: Secondary | ICD-10-CM

## 2012-05-25 NOTE — Progress Notes (Signed)
Subjective:     Patient ID: Beverly Bryant, female   DOB: [birth year], 52 y.o.   MRN: [id number]  HPI Pt s/p TAH with bilateral salpingectomy 05/04/12.  Pt without complaints.  Stopped pain meds after 4 days.  No problems with voiding or stooling.  Passing gas and tolerating regular diet.    Review of Systems     Objective:   Physical Exam BP 130/80  Pulse 72  Temp(Src) 97.9 F (36.6 C) (Oral)  Ht 5\' 8"  (1.727 m)  Wt 239 lb (108.41 kg)  BMI 36.35 kg/m2  LMP 04/29/2012 Pt in NAD Abd: obese, NT/ND. No rebound or guarding. Incision- clean, dry and intact almost completely healed   Path 05/04/12 Diagnosis Uterus and bilateral fallopian tubes, with cervix - LEIOMYOMATA. - ENDOMETRIUM: BENIGN INACTIVE ENDOMETRIUM WITH PSEUDODECIDUALIZED STROMA, CONSISTENT WITH EXOGENOUS HORMONAL EFFECT. NO EVIDENCE OF ATYPIA, HYPERPLASIA OR MALIGNANCY. - CERVIX: BENIGN SQUAMOUS MUCOSA AND ENDOCERVICAL MUCOSA, NO DYSPLASIA OR MALIGNANCY. - UTERINE SEROSA: ADHESIONS, NO EVIDENCE OF ENDOMETRIOSIS, ATYPIA OR MALIGNANCY. - BILATERAL FALLOPIAN TUBES: BENIGN FALLOPIAN TUBAL TISSUE, NO EVIDENCE OF ENDOMETRIOSIS, ATYPIA OR MALIGNANCY.     Assessment:     3 week post op check for TAH with bilateral salpingectomy    Plan:     F/u in 3 week or sooner prn

## 2012-05-25 NOTE — Patient Instructions (Signed)
Incision Care  An incision is when a surgeon cuts into your body tissues. After surgery, the incision needs to be cared for properly to prevent infection.   HOME CARE INSTRUCTIONS    Take all medicine as directed by your caregiver. Only take over-the-counter or prescription medicines for pain, discomfort, or fever as directed by your caregiver.   Do not remove your bandage (dressing) or get your incision wet until your surgeon gives you permission. In the event that your dressing becomes wet, dirty, or starts to smell, change the dressing and call your surgeon for instructions as soon as possible.   Take showers. Do not take tub baths, swim, or do anything that may soak the wound until it is healed.   Resume your normal diet and activities as directed or allowed.   Avoid lifting any weight until you are instructed otherwise.   Use anti-itch antihistamine medicine as directed by your caregiver. The wound may itch when it is healing. Do not pick or scratch at the wound.   Follow up with your caregiver for stitch (suture) or staple removal as directed.   Drink enough fluids to keep your urine clear or pale yellow.  SEEK MEDICAL CARE IF:    You have redness, swelling, or increasing pain in the wound that is not controlled with medicine.   You have drainage, blood, or pus coming from the wound that lasts longer than 1 day.   You develop muscle aches, chills, or a general ill feeling.   You notice a bad smell coming from the wound or dressing.   Your wound edges separate after the sutures, staples, or skin adhesive strips have been removed.   You develop persistent nausea or vomiting.  SEEK IMMEDIATE MEDICAL CARE IF:    You have a fever.   You develop a rash.   You develop dizzy episodes or faint while standing.   You have difficulty breathing.   You develop any reaction or side effects to medicine given.  MAKE SURE YOU:    Understand these instructions.   Will watch your condition.   Will get help  right away if you are not doing well or get worse.  Document Released: 10/10/2004 Document Revised: 06/15/2011 Document Reviewed: 07/27/2010  [employer] Patient Information 2013 [employer]

## 2012-06-15 ENCOUNTER — Encounter: Payer: Self-pay | Admitting: Obstetrics & Gynecology

## 2012-06-15 ENCOUNTER — Ambulatory Visit (INDEPENDENT_AMBULATORY_CARE_PROVIDER_SITE_OTHER): Payer: Medicare Other | Admitting: Obstetrics & Gynecology

## 2012-06-15 VITALS — BP 151/93 | HR 64 | Temp 97.9°F | Ht 68.0 in | Wt 237.1 lb

## 2012-06-15 DIAGNOSIS — D219 Benign neoplasm of connective and other soft tissue, unspecified: Secondary | ICD-10-CM

## 2012-06-15 DIAGNOSIS — D259 Leiomyoma of uterus, unspecified: Secondary | ICD-10-CM | POA: Diagnosis not present

## 2012-06-15 NOTE — Patient Instructions (Signed)

## 2012-06-15 NOTE — Progress Notes (Signed)
Subjective:     Patient ID: Beverly Bryant, female   DOB: 1960/06/10, 52 y.o.   MRN: [id number]  HPI Pt is here for her 5 week post op check.  She reports that she is doing well.  She denies pain.  She reports normal bowel or bladder function.  She is back to full activity.  She is not sexually but, was not prior to surgery.      Review of Systems     Objective:   Physical Exam BP 151/93  Pulse 64  Temp(Src) 97.9 F (36.6 C)  Ht 5\' 8"  (1.727 m)  Wt 237 lb 1.6 oz (107.548 kg)  BMI 36.06 kg/m2  LMP 04/29/2012 Abd: soft, NT, ND GU: EGBUS: no lesions Vagina: no blood in vault CervixUterus: Surgically absent Adnexa: no masses; sl tender    surg path: 05/04/2012 Diagnosis Uterus and bilateral fallopian tubes, with cervix - LEIOMYOMATA. - ENDOMETRIUM: BENIGN INACTIVE ENDOMETRIUM WITH PSEUDODECIDUALIZED STROMA, CONSISTENT WITH EXOGENOUS HORMONAL EFFECT. NO EVIDENCE OF ATYPIA, HYPERPLASIA OR MALIGNANCY. - CERVIX: BENIGN SQUAMOUS MUCOSA AND ENDOCERVICAL MUCOSA, NO DYSPLASIA OR MALIGNANCY. - UTERINE SEROSA: ADHESIONS, NO EVIDENCE OF ENDOMETRIOSIS, ATYPIA OR MALIGNANCY. - BILATERAL FALLOPIAN TUBES: BENIGN FALLOPIAN TUBAL TISSUE, NO EVIDENCE OF ENDOMETRIOSIS, ATYPIA OR MALIGNANCY.      Assessment:     6 week Post op check     Plan:     F/u 1 year or sooner prn

## 2012-08-30 ENCOUNTER — Other Ambulatory Visit: Payer: Self-pay | Admitting: Obstetrics & Gynecology

## 2012-08-30 DIAGNOSIS — D649 Anemia, unspecified: Secondary | ICD-10-CM

## 2012-08-30 MED ORDER — INTEGRA F 125-1 MG PO CAPS: 1.0000 | ORAL_CAPSULE | Freq: Every day | ORAL | Status: AC

## 2012-09-01 DIAGNOSIS — F3132 Bipolar disorder, current episode depressed, moderate: Secondary | ICD-10-CM | POA: Diagnosis not present

## 2012-09-29 DIAGNOSIS — F3132 Bipolar disorder, current episode depressed, moderate: Secondary | ICD-10-CM | POA: Diagnosis not present

## 2012-10-26 DIAGNOSIS — D500 Iron deficiency anemia secondary to blood loss (chronic): Secondary | ICD-10-CM | POA: Diagnosis not present

## 2012-10-26 DIAGNOSIS — I10 Essential (primary) hypertension: Secondary | ICD-10-CM | POA: Diagnosis not present

## 2012-10-26 DIAGNOSIS — Z136 Encounter for screening for cardiovascular disorders: Secondary | ICD-10-CM | POA: Diagnosis not present

## 2012-10-26 DIAGNOSIS — Z79899 Other long term (current) drug therapy: Secondary | ICD-10-CM | POA: Diagnosis not present

## 2012-10-26 DIAGNOSIS — J309 Allergic rhinitis, unspecified: Secondary | ICD-10-CM | POA: Diagnosis not present

## 2012-11-03 DIAGNOSIS — F3132 Bipolar disorder, current episode depressed, moderate: Secondary | ICD-10-CM | POA: Diagnosis not present

## 2013-01-05 DIAGNOSIS — F3132 Bipolar disorder, current episode depressed, moderate: Secondary | ICD-10-CM | POA: Diagnosis not present

## 2013-02-09 ENCOUNTER — Other Ambulatory Visit: Payer: Self-pay

## 2013-03-07 DIAGNOSIS — F411 Generalized anxiety disorder: Secondary | ICD-10-CM | POA: Diagnosis not present

## 2013-03-09 DIAGNOSIS — F3132 Bipolar disorder, current episode depressed, moderate: Secondary | ICD-10-CM | POA: Diagnosis not present

## 2013-04-11 DIAGNOSIS — F411 Generalized anxiety disorder: Secondary | ICD-10-CM | POA: Diagnosis not present

## 2013-04-25 DIAGNOSIS — F4322 Adjustment disorder with anxiety: Secondary | ICD-10-CM | POA: Diagnosis not present

## 2013-04-25 DIAGNOSIS — F411 Generalized anxiety disorder: Secondary | ICD-10-CM | POA: Diagnosis not present

## 2013-05-04 DIAGNOSIS — F411 Generalized anxiety disorder: Secondary | ICD-10-CM | POA: Diagnosis not present

## 2013-05-04 DIAGNOSIS — F4322 Adjustment disorder with anxiety: Secondary | ICD-10-CM | POA: Diagnosis not present

## 2013-05-08 DIAGNOSIS — F4322 Adjustment disorder with anxiety: Secondary | ICD-10-CM | POA: Diagnosis not present

## 2013-05-08 DIAGNOSIS — F411 Generalized anxiety disorder: Secondary | ICD-10-CM | POA: Diagnosis not present

## 2013-05-11 DIAGNOSIS — F3132 Bipolar disorder, current episode depressed, moderate: Secondary | ICD-10-CM | POA: Diagnosis not present

## 2013-05-16 DIAGNOSIS — F411 Generalized anxiety disorder: Secondary | ICD-10-CM | POA: Diagnosis not present

## 2013-05-16 DIAGNOSIS — F4322 Adjustment disorder with anxiety: Secondary | ICD-10-CM | POA: Diagnosis not present

## 2013-05-30 DIAGNOSIS — F4322 Adjustment disorder with anxiety: Secondary | ICD-10-CM | POA: Diagnosis not present

## 2013-05-31 DIAGNOSIS — F4322 Adjustment disorder with anxiety: Secondary | ICD-10-CM | POA: Diagnosis not present

## 2013-06-19 DIAGNOSIS — R7301 Impaired fasting glucose: Secondary | ICD-10-CM | POA: Diagnosis not present

## 2013-06-19 DIAGNOSIS — I10 Essential (primary) hypertension: Secondary | ICD-10-CM | POA: Diagnosis not present

## 2013-06-19 DIAGNOSIS — N951 Menopausal and female climacteric states: Secondary | ICD-10-CM | POA: Diagnosis not present

## 2013-06-19 DIAGNOSIS — F4322 Adjustment disorder with anxiety: Secondary | ICD-10-CM | POA: Diagnosis not present

## 2013-06-19 DIAGNOSIS — D696 Thrombocytopenia, unspecified: Secondary | ICD-10-CM | POA: Diagnosis not present

## 2013-06-19 DIAGNOSIS — Z79899 Other long term (current) drug therapy: Secondary | ICD-10-CM | POA: Diagnosis not present

## 2013-06-19 DIAGNOSIS — D649 Anemia, unspecified: Secondary | ICD-10-CM | POA: Diagnosis not present

## 2013-06-19 DIAGNOSIS — G479 Sleep disorder, unspecified: Secondary | ICD-10-CM | POA: Diagnosis not present

## 2013-06-20 DIAGNOSIS — F4322 Adjustment disorder with anxiety: Secondary | ICD-10-CM | POA: Diagnosis not present

## 2013-06-27 DIAGNOSIS — F4322 Adjustment disorder with anxiety: Secondary | ICD-10-CM | POA: Diagnosis not present

## 2013-07-06 DIAGNOSIS — F3132 Bipolar disorder, current episode depressed, moderate: Secondary | ICD-10-CM | POA: Diagnosis not present

## 2013-07-06 DIAGNOSIS — Z79899 Other long term (current) drug therapy: Secondary | ICD-10-CM | POA: Diagnosis not present

## 2013-07-06 DIAGNOSIS — I10 Essential (primary) hypertension: Secondary | ICD-10-CM | POA: Diagnosis not present

## 2013-07-11 DIAGNOSIS — F4322 Adjustment disorder with anxiety: Secondary | ICD-10-CM | POA: Diagnosis not present

## 2013-07-12 ENCOUNTER — Other Ambulatory Visit: Payer: Self-pay | Admitting: Obstetrics & Gynecology

## 2013-07-12 ENCOUNTER — Ambulatory Visit (INDEPENDENT_AMBULATORY_CARE_PROVIDER_SITE_OTHER): Payer: Medicare Other | Admitting: Obstetrics & Gynecology

## 2013-07-12 ENCOUNTER — Encounter: Payer: Self-pay | Admitting: Obstetrics & Gynecology

## 2013-07-12 VITALS — BP 163/92 | HR 70 | Temp 97.1°F | Ht 68.0 in | Wt 249.1 lb

## 2013-07-12 DIAGNOSIS — Z01419 Encounter for gynecological examination (general) (routine) without abnormal findings: Secondary | ICD-10-CM

## 2013-07-12 DIAGNOSIS — Z1239 Encounter for other screening for malignant neoplasm of breast: Secondary | ICD-10-CM

## 2013-07-12 DIAGNOSIS — Z1231 Encounter for screening mammogram for malignant neoplasm of breast: Secondary | ICD-10-CM

## 2013-07-12 DIAGNOSIS — F4322 Adjustment disorder with anxiety: Secondary | ICD-10-CM | POA: Diagnosis not present

## 2013-07-12 DIAGNOSIS — R232 Flushing: Secondary | ICD-10-CM

## 2013-07-12 MED ORDER — ESTROGENS CONJUGATED 0.625 MG PO TABS: 0.6250 mg | ORAL_TABLET | Freq: Every day | ORAL | Status: AC

## 2013-07-12 NOTE — Patient Instructions (Addendum)
Menopause Menopause is the normal time of life when menstrual periods stop completely. Menopause is complete when you have missed 12 consecutive menstrual periods. It usually occurs between the ages of 8 years and 48 years. Very rarely does a woman develop menopause before the age of 60 years. At menopause, your ovaries stop producing the female hormones estrogen and progesterone. This can cause undesirable symptoms and also affect your health. Sometimes the symptoms may occur 4 5 years before the menopause begins. There is no relationship between menopause and:  Oral contraceptives.  Number of children you had.  Race.  The age your menstrual periods started (menarche). Heavy smokers and very thin women may develop menopause earlier in life. CAUSES  The ovaries stop producing the female hormones estrogen and progesterone.  Other causes include:  Surgery to remove both ovaries.  The ovaries stop functioning for no known reason.  Tumors of the pituitary gland in the brain.  Medical disease that affects the ovaries and hormone production.  Radiation treatment to the abdomen or pelvis.  Chemotherapy that affects the ovaries. SYMPTOMS   Hot flashes.  Night sweats.  Decrease in sex drive.  Vaginal dryness and thinning of the vagina causing painful intercourse.  Dryness of the skin and developing wrinkles.  Headaches.  Tiredness.  Irritability.  Memory problems.  Weight gain.  Bladder infections.  Hair growth of the face and chest.  Infertility. More serious symptoms include:  Loss of bone (osteoporosis) causing breaks (fractures).  Depression.  Hardening and narrowing of the arteries (atherosclerosis) causing heart attacks and strokes. DIAGNOSIS   When the menstrual periods have stopped for 12 straight months.  Physical exam.  Hormone studies of the blood. TREATMENT  There are many treatment choices and nearly as many questions about them. The  decisions to treat or not to treat menopausal changes is an individual choice made with your health care provider. Your health care provider can discuss the treatments with you. Together, you can decide which treatment will work best for you. Your treatment choices may include:   Hormone therapy (estrogen and progesterone).  Non-hormonal medicines.  Treating the individual symptoms with medicine (for example antidepressants for depression).  Herbal medicines that may help specific symptoms.  Counseling by a psychiatrist or psychologist.  Group therapy.  Lifestyle changes including:  Eating healthy.  Regular exercise.  Limiting caffeine and alcohol.  Stress management and meditation.  No treatment. HOME CARE INSTRUCTIONS   Take the medicine your health care provider gives you as directed.  Get plenty of sleep and rest.  Exercise regularly.  Eat a diet that contains calcium (good for the bones) and soy products (acts like estrogen hormone).  Avoid alcoholic beverages.  Do not smoke.  If you have hot flashes, dress in layers.  Take supplements, calcium, and vitamin D to strengthen bones.  You can use over-the-counter lubricants or moisturizers for vaginal dryness.  Group therapy is sometimes very helpful.  Acupuncture may be helpful in some cases. SEEK MEDICAL CARE IF:   You are not sure you are in menopause.  You are having menopausal symptoms and need advice and treatment.  You are still having menstrual periods after age 71 years.  You have pain with intercourse.  Menopause is complete (no menstrual period for 12 months) and you develop vaginal bleeding.  You need a referral to a specialist (gynecologist, psychiatrist, or psychologist) for treatment. SEEK IMMEDIATE MEDICAL CARE IF:   You have severe depression.  You have excessive vaginal bleeding.  You fell and think you have a broken bone.  You have pain when you urinate.  You develop leg or  chest pain.  You have a fast pounding heart beat (palpitations).  You have severe headaches.  You develop vision problems.  You feel a lump in your breast.  You have abdominal pain or severe indigestion. Document Released: 06/13/2003 Document Revised: 11/23/2012 Document Reviewed: 10/20/2012 ExitCare Patient Information 2014 [employer]. Hormone Therapy At menopause, your body begins making less estrogen and progesterone hormones. This causes the body to stop having menstrual periods. This is because estrogen and progesterone hormones control your periods and menstrual cycle. A lack of estrogen may cause symptoms such as:  Hot flushes (or hot flashes).  Vaginal dryness.  Dry skin.  Loss of sex drive.  Risk of bone loss (osteoporosis). When this happens, you may choose to take hormone therapy to get back the estrogen lost during menopause. When the hormone estrogen is given alone, it is usually referred to as ET (Estrogen Therapy). When the hormone progestin is combined with estrogen, it is generally called HT (Hormone Therapy). This was formerly known as hormone replacement therapy (HRT). Your caregiver can help you make a decision on what will be best for you. The decision to use HT seems to change often as new studies are done. Many studies do not agree on the benefits of hormone replacement therapy. LIKELY BENEFITS OF HT INCLUDE PROTECTION FROM:  Hot Flushes (also called hot flashes) - A hot flush is a sudden feeling of heat that spreads over the face and body. The skin may redden like a blush. It is connected with sweats and sleep disturbance. Women going through menopause may have hot flushes a few times a month or several times per day depending on the woman.  Osteoporosis (bone loss)- Estrogen helps guard against bone loss. After menopause, a woman's bones slowly lose calcium and become weak and brittle. As a result, bones are more likely to break. The hip, wrist, and spine  are affected most often. Hormone therapy can help slow bone loss after menopause. Weight bearing exercise and taking calcium with vitamin D also can help prevent bone loss. There are also medications that your caregiver can prescribe that can help prevent osteoporosis.  Vaginal Dryness - Loss of estrogen causes changes in the vagina. Its lining may become thin and dry. These changes can cause pain and bleeding during sexual intercourse. Dryness can also lead to infections. This can cause burning and itching. (Vaginal estrogen treatment can help relieve pain, itching, and dryness.)  Urinary Tract Infections are more common after menopause because of lack of estrogen. Some women also develop urinary incontinence because of low estrogen levels in the vagina and bladder.  Possible other benefits of estrogen include a positive effect on mood and short-term memory in women. RISKS AND COMPLICATIONS  Using estrogen alone without progesterone causes the lining of the uterus to grow. This increases the risk of lining of the uterus (endometrial) cancer. Your caregiver should give another hormone called progestin if you have a uterus.  Women who take combined (estrogen and progestin) HT appear to have an increased risk of breast cancer. The risk appears to be small, but increases throughout the time that HT is taken.  Combined therapy also makes the breast tissue slightly denser which makes it harder to read mammograms (breast X-rays).  Combined, estrogen and progesterone therapy can be taken together every day, in which case there may be spotting of blood.   HT therapy can be taken cyclically in which case you will have menstrual periods. Cyclically means HT is taken for a set amount of days, then not taken, then this process is repeated.  HT may increase the risk of stroke, heart attack, breast cancer and forming blood clots in your leg.  Transdermal estrogen (estrogen that is absorbed through the skin with a  patch or a cream) may have more positive results with:  Cholesterol.  Blood pressure.  Blood clots. Having the following conditions may indicate you should not have HT:  Endometrial cancer.  Liver disease.  Breast cancer.  Heart disease.  History of blood clots.  Stroke. TREATMENT   If you choose to take HT and have a uterus, usually estrogen and progestin are prescribed.  Your caregiver will help you decide the best way to take the medications.  Possible ways to take estrogen include:  Pills.  Patches.  Gels.  Sprays.  Vaginal estrogen cream, rings and tablets.  It is best to take the lowest dose possible that will help your symptoms and take them for the shortest period of time that you can.  Hormone therapy can help relieve some of the problems (symptoms) that affect women at menopause. Before making a decision about HT, talk to your caregiver about what is best for you. Be well informed and comfortable with your decisions. HOME CARE INSTRUCTIONS   Follow your caregivers advice when taking the medications.  A Pap test is done to screen for cervical cancer.  The first Pap test should be done at age 41.  Between ages 54 and 70, Pap tests are repeated every 2 years.  Beginning at age 12, you are advised to have a Pap test every 3 years as long as your past 3 Pap tests have been normal.  Some women have medical problems that increase the chance of getting cervical cancer. Talk to your caregiver about these problems. It is especially important to talk to your caregiver if a new problem develops soon after your last Pap test. In these cases, your caregiver may recommend more frequent screening and Pap tests.  The above recommendations are the same for women who have or have not gotten the vaccine for HPV (Human Papillomavirus).  If you had a hysterectomy for a problem that was not a cancer or a condition that could lead to cancer, then you no longer need Pap  tests. However, even if you no longer need a Pap test, a regular exam is a good idea to make sure no other problems are starting.   If you are between ages 63 and 23, and you have had normal Pap tests going back 10 years, you no longer need Pap tests. However, even if you no longer need a Pap test, a regular exam is a good idea to make sure no other problems are starting.   If you have had past treatment for cervical cancer or a condition that could lead to cancer, you need Pap tests and screening for cancer for at least 20 years after your treatment.  If Pap tests have been discontinued, risk factors (such as a new sexual partner) need to be re-assessed to determine if screening should be resumed.  Some women may need screenings more often if they are at high risk for cervical cancer.  Get mammograms done as per the advice of your caregiver. SEEK IMMEDIATE MEDICAL CARE IF:  You develop abnormal vaginal bleeding.  You have pain or swelling in your  legs, shortness of breath, or chest pain.  You develop dizziness or headaches.  You have lumps or changes in your breasts or armpits.  You have slurred speech.  You develop weakness or numbness of your arms or legs.  You have pain, burning, or bleeding when urinating.  You develop abdominal pain. Document Released: 12/20/2002 Document Revised: 06/15/2011 Document Reviewed: 04/09/2010 ExitCare Patient Information 2014 [employer].  

## 2013-07-17 NOTE — Progress Notes (Signed)
Patient ID: Beverly Bryant, female   DOB: 03-17-1961, 53 y.o.   MRN: [id number] Subjective:     Beverly Bryant is a 53 y.o. female here for a routine exam.  Current complaints: none.     Gynecologic History Patient's last menstrual period was 04/29/2012. Last Pap: 03/2012. Results were: normal; neg HR HPV  Last mammogram: 03/2012. Results were: normal  Obstetric History OB History  Gravida Para Term Preterm AB SAB TAB Ectopic Multiple Living  0 0 0 0 0 0 0 0 0 0          The following portions of the patient's history were reviewed and updated as appropriate: allergies, current medications, past family history, past medical history, past social history, past surgical history and problem list.  Review of Systems A comprehensive review of systems was negative.    Objective:    BP 163/92  Pulse 70  Temp(Src) 97.1 F (36.2 C) (Oral)  Ht 5\' 8"  (1.727 m)  Wt 249 lb 1.6 oz (112.991 kg)  BMI 37.88 kg/m2  LMP 04/29/2012  General Appearance:    Alert, cooperative, no distress, appears stated age                 Neck:   Supple, symmetrical, trachea midline, no adenopathy;    thyroid:  no enlargement/tenderness/nodules; no carotid   bruit or JVD  Back:     Symmetric, no curvature, ROM normal, no CVA tenderness  Lungs:     Clear to auscultation bilaterally, respirations unlabored  Chest Wall:    No tenderness or deformity   Heart:    Regular rate and rhythm, S1 and S2 normal, no murmur, rub   or gallop  Breast Exam:    No tenderness, masses, or nipple abnormality  Abdomen:     Soft, non-tender, bowel sounds active all four quadrants,    no masses, no organomegaly  Genitalia:    Normal female without lesion, discharge or tenderness     Extremities:   Extremities normal, atraumatic, no cyanosis or edema  Pulses:   2+ and symmetric all extremities  Skin:   Skin color, texture, turgor normal, no rashes or lesions            Assessment:    Healthy female  exam.   discussed with pt new PAP guidelines.  If PAP and HPV are negative pt may have next PAP in 3 years Plan:    Mammogram ordered.   F/u in 1 year or sooner prn PAP with HR HPV

## 2013-07-18 DIAGNOSIS — F4322 Adjustment disorder with anxiety: Secondary | ICD-10-CM | POA: Diagnosis not present

## 2013-07-19 ENCOUNTER — Ambulatory Visit (HOSPITAL_COMMUNITY)
Admission: RE | Admit: 2013-07-19 | Discharge: 2013-07-19 | Disposition: A | Discharge status: Home / Self Care | Payer: Medicare Other | Source: Ambulatory Visit | Attending: Obstetrics & Gynecology | Admitting: Obstetrics & Gynecology

## 2013-07-19 DIAGNOSIS — Z1231 Encounter for screening mammogram for malignant neoplasm of breast: Secondary | ICD-10-CM | POA: Diagnosis not present

## 2013-07-27 DIAGNOSIS — Z79899 Other long term (current) drug therapy: Secondary | ICD-10-CM | POA: Diagnosis not present

## 2013-07-27 DIAGNOSIS — I10 Essential (primary) hypertension: Secondary | ICD-10-CM | POA: Diagnosis not present

## 2013-08-08 DIAGNOSIS — F4322 Adjustment disorder with anxiety: Secondary | ICD-10-CM | POA: Diagnosis not present

## 2013-08-15 DIAGNOSIS — F411 Generalized anxiety disorder: Secondary | ICD-10-CM | POA: Diagnosis not present

## 2013-08-15 DIAGNOSIS — F4322 Adjustment disorder with anxiety: Secondary | ICD-10-CM | POA: Diagnosis not present

## 2013-08-22 DIAGNOSIS — F411 Generalized anxiety disorder: Secondary | ICD-10-CM | POA: Diagnosis not present

## 2013-08-28 DIAGNOSIS — F411 Generalized anxiety disorder: Secondary | ICD-10-CM | POA: Diagnosis not present

## 2013-08-29 DIAGNOSIS — F411 Generalized anxiety disorder: Secondary | ICD-10-CM | POA: Diagnosis not present

## 2013-08-31 DIAGNOSIS — F3132 Bipolar disorder, current episode depressed, moderate: Secondary | ICD-10-CM | POA: Diagnosis not present

## 2013-09-05 DIAGNOSIS — F411 Generalized anxiety disorder: Secondary | ICD-10-CM | POA: Diagnosis not present

## 2013-09-19 DIAGNOSIS — F411 Generalized anxiety disorder: Secondary | ICD-10-CM | POA: Diagnosis not present

## 2013-09-26 DIAGNOSIS — F411 Generalized anxiety disorder: Secondary | ICD-10-CM | POA: Diagnosis not present

## 2013-10-03 DIAGNOSIS — F411 Generalized anxiety disorder: Secondary | ICD-10-CM | POA: Diagnosis not present

## 2013-10-10 DIAGNOSIS — F411 Generalized anxiety disorder: Secondary | ICD-10-CM | POA: Diagnosis not present

## 2013-12-05 DIAGNOSIS — F4322 Adjustment disorder with anxiety: Secondary | ICD-10-CM | POA: Diagnosis not present

## 2013-12-12 DIAGNOSIS — F4322 Adjustment disorder with anxiety: Secondary | ICD-10-CM | POA: Diagnosis not present

## 2013-12-26 DIAGNOSIS — F4322 Adjustment disorder with anxiety: Secondary | ICD-10-CM | POA: Diagnosis not present

## 2014-01-02 DIAGNOSIS — F4322 Adjustment disorder with anxiety: Secondary | ICD-10-CM | POA: Diagnosis not present

## 2014-01-09 DIAGNOSIS — F4322 Adjustment disorder with anxiety: Secondary | ICD-10-CM | POA: Diagnosis not present

## 2014-01-10 DIAGNOSIS — F3181 Bipolar II disorder: Secondary | ICD-10-CM | POA: Diagnosis not present

## 2014-01-13 DIAGNOSIS — F4322 Adjustment disorder with anxiety: Secondary | ICD-10-CM | POA: Diagnosis not present

## 2014-01-16 DIAGNOSIS — F4322 Adjustment disorder with anxiety: Secondary | ICD-10-CM | POA: Diagnosis not present

## 2014-01-23 DIAGNOSIS — F4322 Adjustment disorder with anxiety: Secondary | ICD-10-CM | POA: Diagnosis not present

## 2014-01-30 DIAGNOSIS — F4322 Adjustment disorder with anxiety: Secondary | ICD-10-CM | POA: Diagnosis not present

## 2014-02-06 DIAGNOSIS — F4322 Adjustment disorder with anxiety: Secondary | ICD-10-CM | POA: Diagnosis not present

## 2014-02-09 DIAGNOSIS — F4322 Adjustment disorder with anxiety: Secondary | ICD-10-CM | POA: Diagnosis not present

## 2014-02-13 DIAGNOSIS — F4322 Adjustment disorder with anxiety: Secondary | ICD-10-CM | POA: Diagnosis not present

## 2014-02-20 DIAGNOSIS — F4322 Adjustment disorder with anxiety: Secondary | ICD-10-CM | POA: Diagnosis not present

## 2014-02-22 DIAGNOSIS — H6123 Impacted cerumen, bilateral: Secondary | ICD-10-CM | POA: Diagnosis not present

## 2014-02-22 DIAGNOSIS — Z79890 Hormone replacement therapy (postmenopausal): Secondary | ICD-10-CM | POA: Diagnosis not present

## 2014-02-22 DIAGNOSIS — I10 Essential (primary) hypertension: Secondary | ICD-10-CM | POA: Diagnosis not present

## 2014-02-22 DIAGNOSIS — F419 Anxiety disorder, unspecified: Secondary | ICD-10-CM | POA: Diagnosis not present

## 2014-06-08 DIAGNOSIS — F250 Schizoaffective disorder, bipolar type: Secondary | ICD-10-CM | POA: Diagnosis not present

## 2014-07-20 IMAGING — CR DG CHEST 2V
2 series · 2 of 2 positions shown · non-contrast
Comparison: None.

CLINICAL DATA: Chest pain.

CHEST - 2 VIEW

[w chest pa]
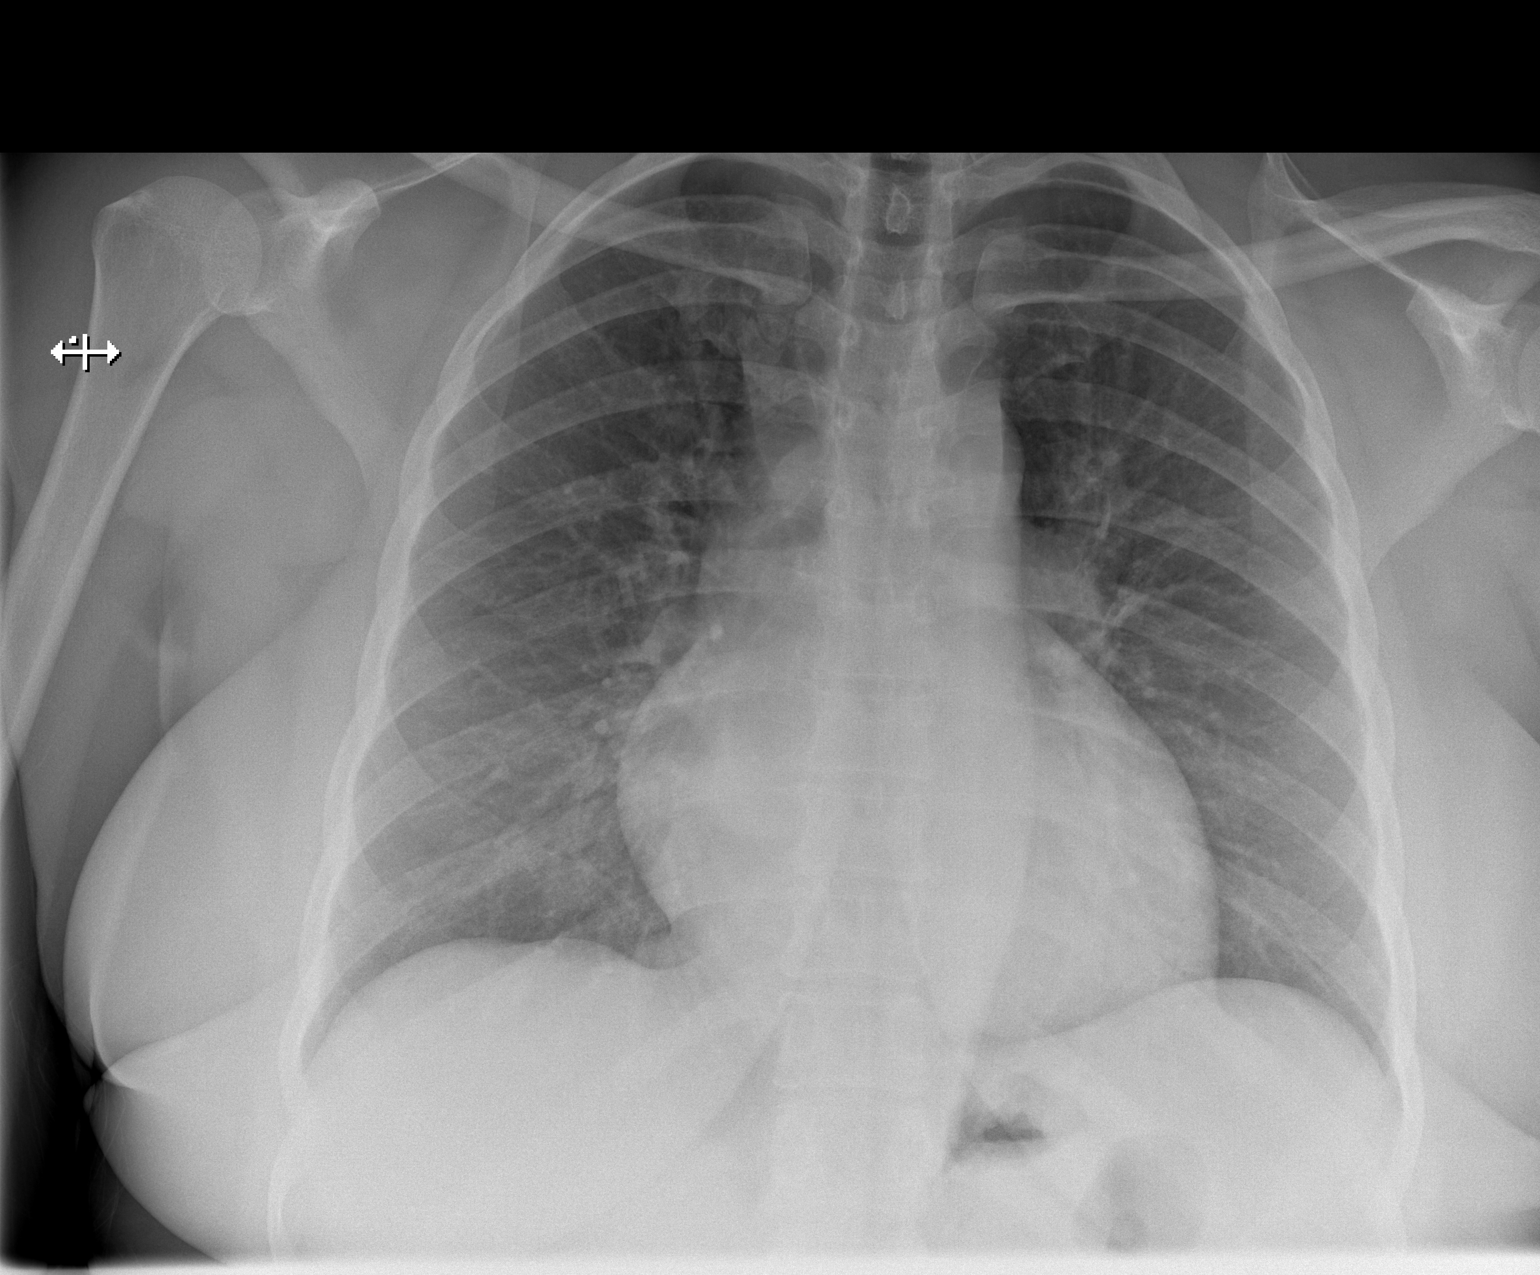

[w chest lat]
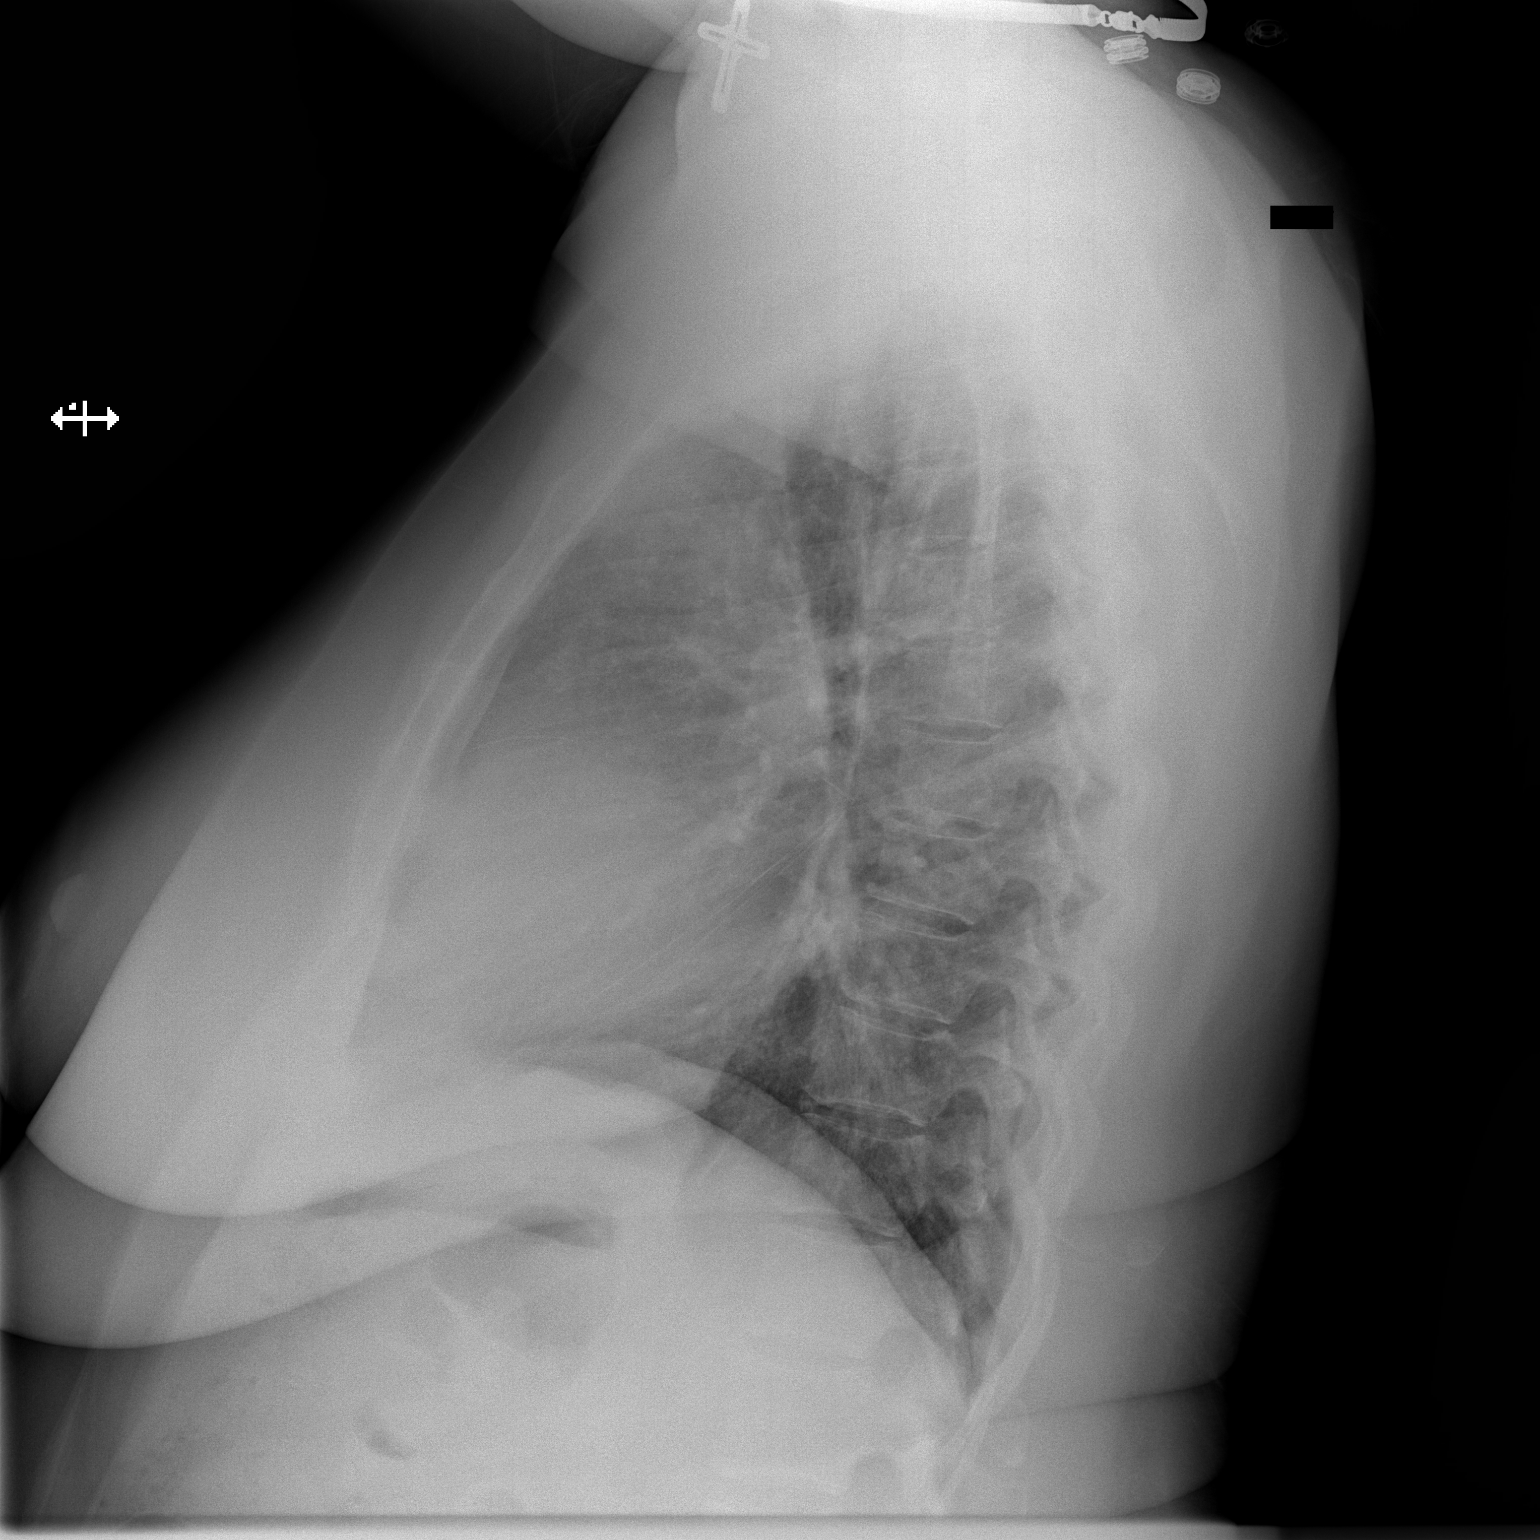

[2 of 2 positions shown; findings below may reference images not displayed]

FINDINGS: Heart size and pulmonary vascularity are normal and the
lungs are clear.  No osseous abnormality.
IMPRESSION: Normal chest.

## 2014-07-21 IMAGING — US US PELVIS COMPLETE
1 series · 13 of 25 positions shown · non-contrast
Comparison: None

CLINICAL DATA: Menorrhagia.  Known fibroids.  History of prior
myomectomy.  Vaginal bleeding for 2 months.



[Series 1: us pelvis complete · 13 of 53 slices shown]
[im 1/53]
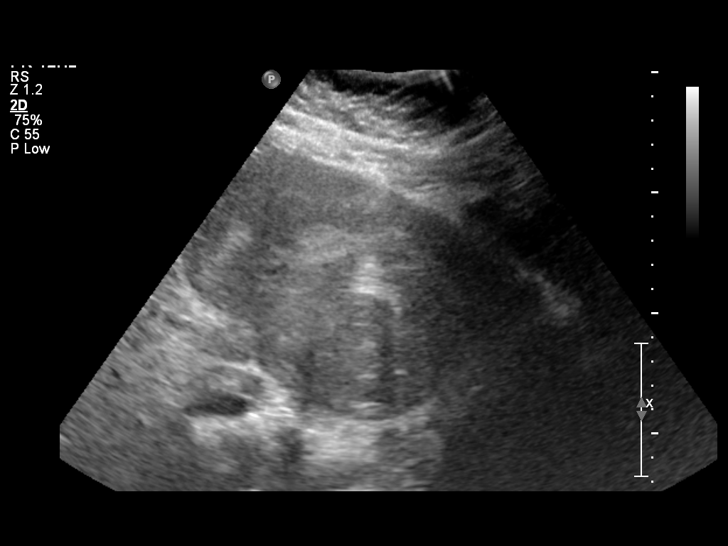
[im 5/53]
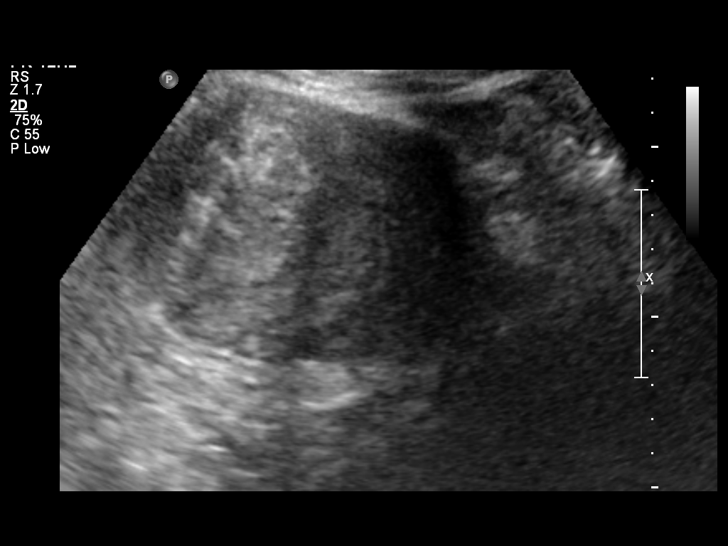
[im 9/53]
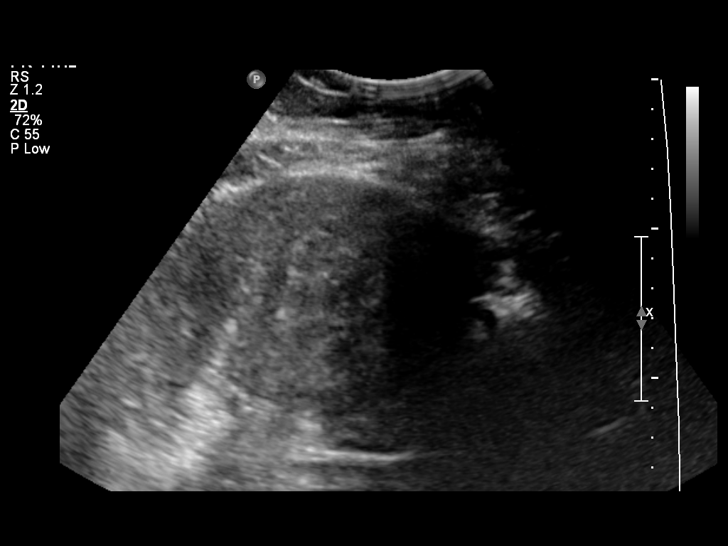
[im 14/53]
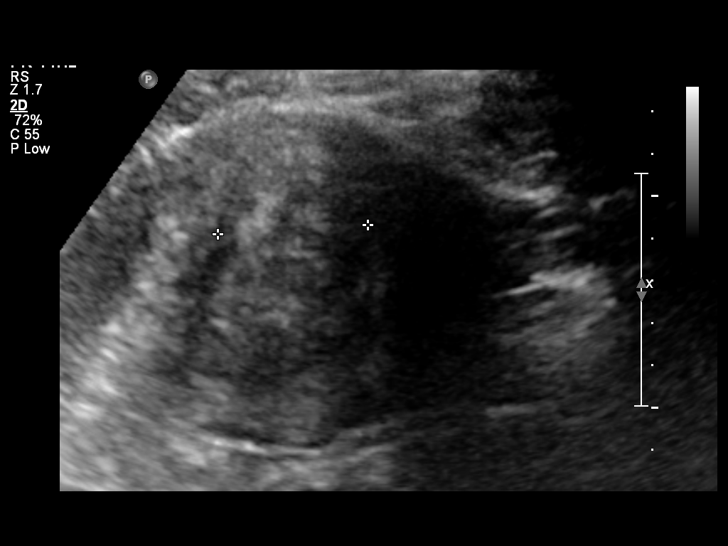
[im 18/53]
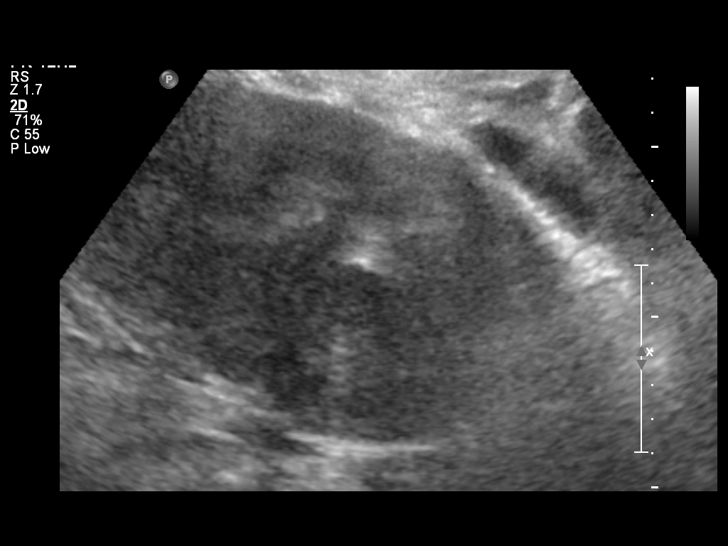
[im 22/53]
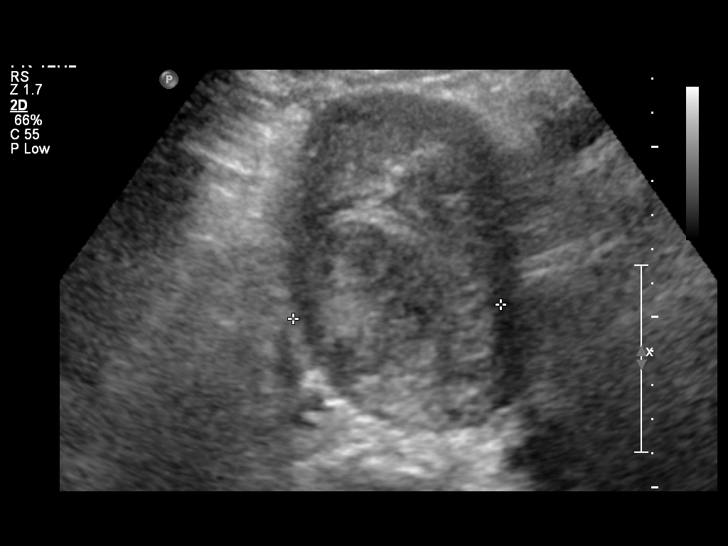
[im 27/53]
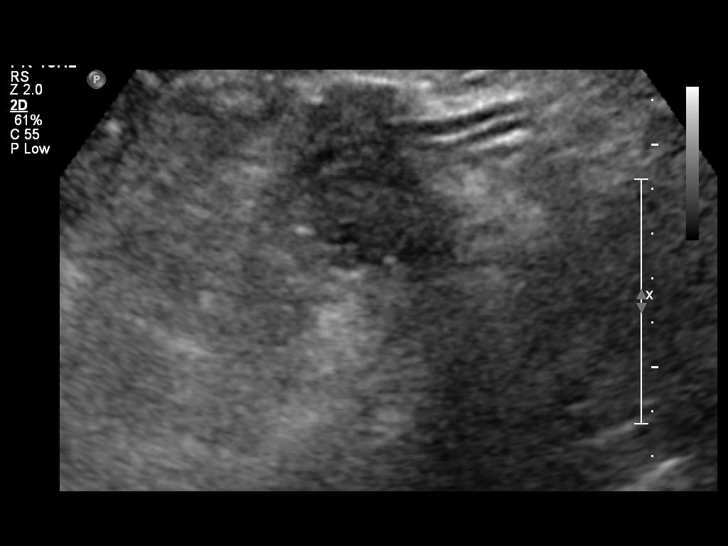
[im 31/53]
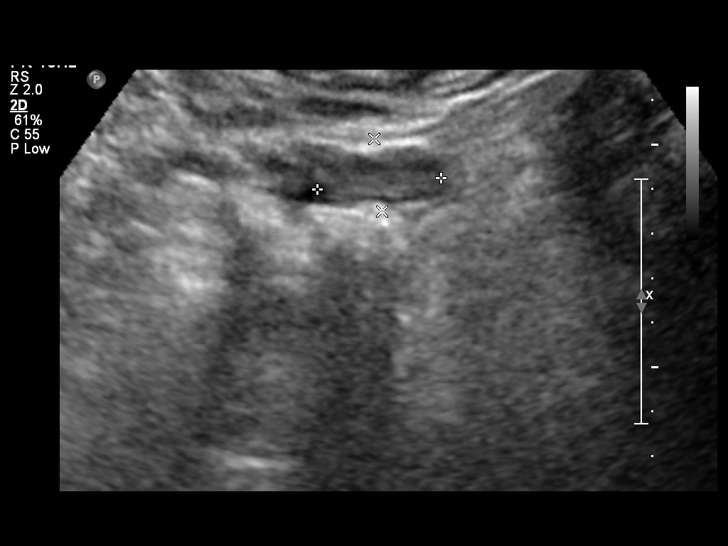
[im 35/53]
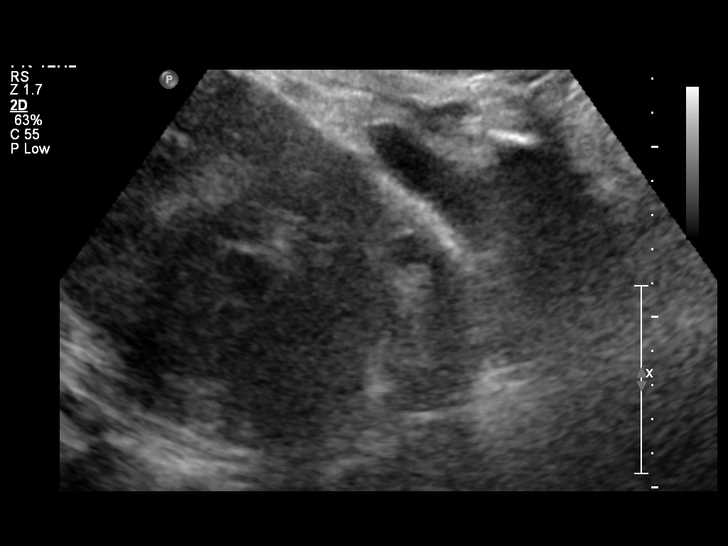
[im 40/53]
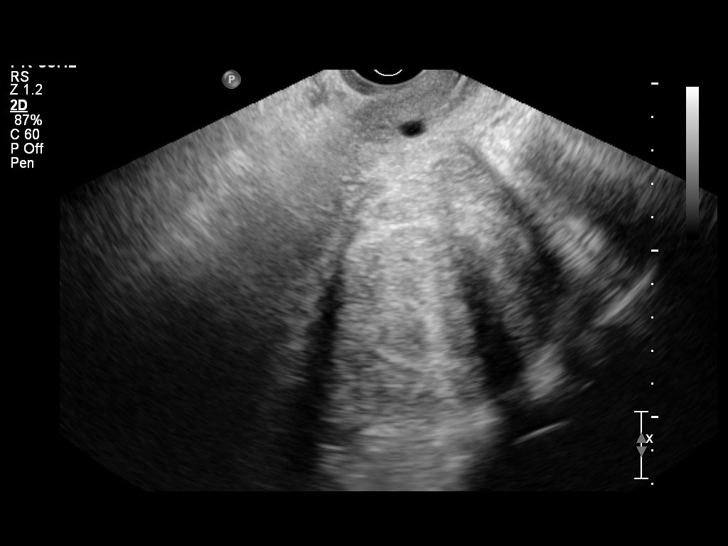
[im 44/53]
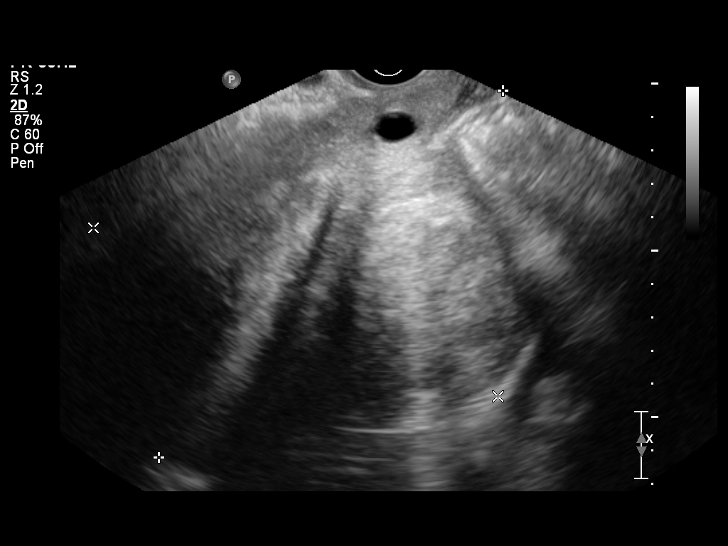
[im 48/53]
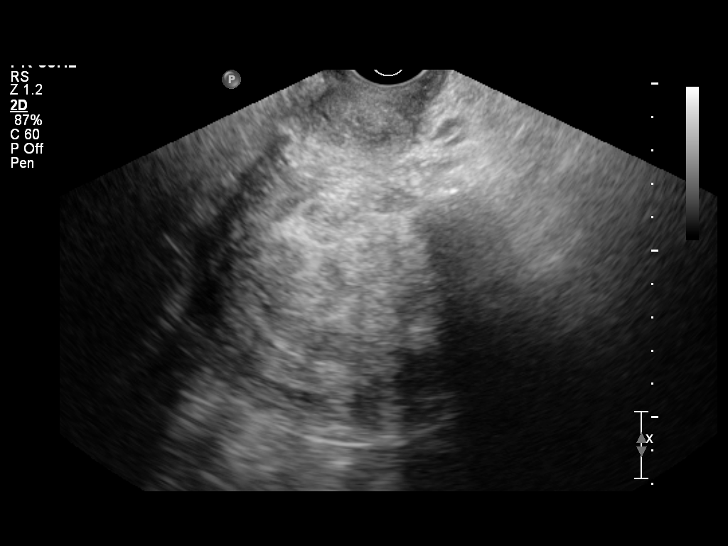
[im 53/53]
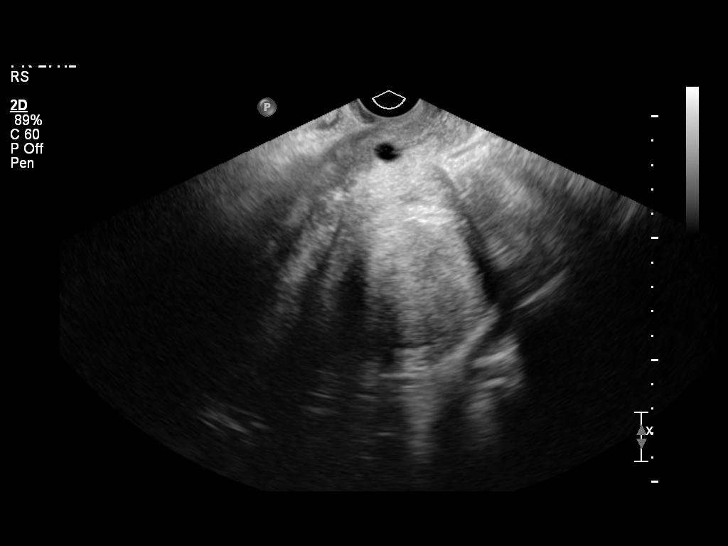

[13 of 25 positions shown; findings below may reference images not displayed]

FINDINGS: Uterus: The uterus is anteverted and enlarged, measuring 18.8 x
x 9.8 cm.  A posterior lower uterine body intramural fibroid with a
submucosal component measures 8.4 x 10.7 x 6.1 cm.  An intramural
fundal fibroid measures 4.3 x 3.8 x 3.5 cm.

Endometrium: Difficult to visualize due to the presence of the
fibroids.  On transvaginal imaging, the endometrium is estimated to
be 1.1 cm in thickness.

Right ovary:  Normal appearance/no adnexal mass.  Measures 3.2 x
2.3 x 2.3 cm.

Left ovary: Normal appearance/no adnexal mass.  Measures 2.8 x
x 2.4 cm.

Other findings: No free fluid visualized.
IMPRESSION: 1.  Enlarged fibroid uterus.  Two discrete fibroids are visualized,
as described above.
2.  Normal ovaries.

## 2014-08-14 DIAGNOSIS — I10 Essential (primary) hypertension: Secondary | ICD-10-CM | POA: Diagnosis not present

## 2014-08-14 DIAGNOSIS — F419 Anxiety disorder, unspecified: Secondary | ICD-10-CM | POA: Diagnosis not present

## 2014-08-14 DIAGNOSIS — E785 Hyperlipidemia, unspecified: Secondary | ICD-10-CM | POA: Diagnosis not present

## 2014-08-17 DIAGNOSIS — F250 Schizoaffective disorder, bipolar type: Secondary | ICD-10-CM | POA: Diagnosis not present

## 2014-10-19 DIAGNOSIS — F250 Schizoaffective disorder, bipolar type: Secondary | ICD-10-CM | POA: Diagnosis not present

## 2014-12-12 DIAGNOSIS — I10 Essential (primary) hypertension: Secondary | ICD-10-CM | POA: Diagnosis not present

## 2014-12-12 DIAGNOSIS — E785 Hyperlipidemia, unspecified: Secondary | ICD-10-CM | POA: Diagnosis not present

## 2014-12-21 DIAGNOSIS — F250 Schizoaffective disorder, bipolar type: Secondary | ICD-10-CM | POA: Diagnosis not present

## 2015-01-23 DIAGNOSIS — F3181 Bipolar II disorder: Secondary | ICD-10-CM | POA: Diagnosis not present

## 2015-02-08 DIAGNOSIS — F3181 Bipolar II disorder: Secondary | ICD-10-CM | POA: Diagnosis not present

## 2015-02-15 DIAGNOSIS — F3181 Bipolar II disorder: Secondary | ICD-10-CM | POA: Diagnosis not present

## 2015-02-22 DIAGNOSIS — F250 Schizoaffective disorder, bipolar type: Secondary | ICD-10-CM | POA: Diagnosis not present

## 2015-02-27 DIAGNOSIS — F3181 Bipolar II disorder: Secondary | ICD-10-CM | POA: Diagnosis not present

## 2015-03-08 DIAGNOSIS — F3181 Bipolar II disorder: Secondary | ICD-10-CM | POA: Diagnosis not present

## 2015-03-15 DIAGNOSIS — F3181 Bipolar II disorder: Secondary | ICD-10-CM | POA: Diagnosis not present

## 2015-03-19 DIAGNOSIS — E782 Mixed hyperlipidemia: Secondary | ICD-10-CM | POA: Diagnosis not present

## 2015-03-19 DIAGNOSIS — Z0000 Encounter for general adult medical examination without abnormal findings: Secondary | ICD-10-CM | POA: Diagnosis not present

## 2015-03-19 DIAGNOSIS — I10 Essential (primary) hypertension: Secondary | ICD-10-CM | POA: Diagnosis not present

## 2015-03-19 DIAGNOSIS — Z23 Encounter for immunization: Secondary | ICD-10-CM | POA: Diagnosis not present

## 2015-04-19 DIAGNOSIS — F3181 Bipolar II disorder: Secondary | ICD-10-CM | POA: Diagnosis not present

## 2015-04-26 DIAGNOSIS — F250 Schizoaffective disorder, bipolar type: Secondary | ICD-10-CM | POA: Diagnosis not present

## 2015-05-03 DIAGNOSIS — F3181 Bipolar II disorder: Secondary | ICD-10-CM | POA: Diagnosis not present

## 2015-05-17 DIAGNOSIS — F3181 Bipolar II disorder: Secondary | ICD-10-CM | POA: Diagnosis not present

## 2015-05-22 DIAGNOSIS — E782 Mixed hyperlipidemia: Secondary | ICD-10-CM | POA: Diagnosis not present

## 2015-05-22 DIAGNOSIS — I10 Essential (primary) hypertension: Secondary | ICD-10-CM | POA: Diagnosis not present

## 2015-05-22 DIAGNOSIS — F411 Generalized anxiety disorder: Secondary | ICD-10-CM | POA: Diagnosis not present

## 2015-05-22 DIAGNOSIS — Z23 Encounter for immunization: Secondary | ICD-10-CM | POA: Diagnosis not present

## 2015-05-22 DIAGNOSIS — E785 Hyperlipidemia, unspecified: Secondary | ICD-10-CM | POA: Diagnosis not present

## 2015-06-13 DIAGNOSIS — F3181 Bipolar II disorder: Secondary | ICD-10-CM | POA: Diagnosis not present

## 2016-05-28 ENCOUNTER — Ambulatory Visit (INDEPENDENT_AMBULATORY_CARE_PROVIDER_SITE_OTHER): Payer: Medicare Other | Admitting: Obstetrics & Gynecology

## 2016-05-28 ENCOUNTER — Encounter: Payer: Self-pay | Admitting: Obstetrics & Gynecology

## 2016-05-28 VITALS — BP 143/80 | HR 62 | Ht 67.0 in | Wt 193.0 lb

## 2016-05-28 DIAGNOSIS — Z0000 Encounter for general adult medical examination without abnormal findings: Secondary | ICD-10-CM

## 2016-05-28 DIAGNOSIS — N952 Postmenopausal atrophic vaginitis: Secondary | ICD-10-CM

## 2016-05-28 DIAGNOSIS — K439 Ventral hernia without obstruction or gangrene: Secondary | ICD-10-CM

## 2016-05-28 DIAGNOSIS — Z1231 Encounter for screening mammogram for malignant neoplasm of breast: Secondary | ICD-10-CM

## 2016-05-28 DIAGNOSIS — Z01419 Encounter for gynecological examination (general) (routine) without abnormal findings: Secondary | ICD-10-CM

## 2016-05-28 NOTE — Progress Notes (Signed)
Subjective:     Francie Abellera is a 56 y.o. female here for a routine exam.  Current complaints: none. Pt is worried abojut hernia. She wants to make sure hta tshe is not doing anything to make it worse. She denies pain. She reports taking Estroven OTC with relief sx. She is not sexually active currently.      Gynecologic History Patient's last menstrual period was 04/29/2012. Contraception: status post hysterectomy Last Pap: 2014 Last mammogram: 07/2013. Results were: normal  Obstetric History OB History  Gravida Para Term Preterm AB Living  0 0 0 0 0 0  SAB TAB Ectopic Multiple Live Births  0 0 0 0          The following portions of the patient's history were reviewed and updated as appropriate: allergies, current medications, past family history, past medical history, past social history, past surgical history and problem list.  Review of Systems Pertinent items are noted in HPI.    Objective:  BP (!) 143/80   Pulse 62   Ht 5\' 7"  (1.702 m)   Wt 193 lb (87.5 kg)   LMP 04/29/2012   BMI 30.23 kg/m  General Appearance:    Alert, cooperative, no distress, appears stated age  Head:    Normocephalic, without obvious abnormality, atraumatic  Eyes:    conjunctiva/corneas clear, EOM's intact, both eyes  Ears:    Normal external ear canals, both ears  Nose:   Nares normal, septum midline, mucosa normal, no drainage    or sinus tenderness  Throat:   Lips, mucosa, and tongue normal; teeth and gums normal  Neck:   Supple, symmetrical, trachea midline, no adenopathy;    thyroid:  no enlargement/tenderness/nodules  Back:     Symmetric, no curvature, ROM normal, no CVA tenderness  Lungs:     Clear to auscultation bilaterally, respirations unlabored  Chest Wall:    No tenderness or deformity   Heart:    Regular rate and rhythm, S1 and S2 normal, no murmur, rub   or gallop  Breast Exam:    No tenderness, masses, or nipple abnormality  Abdomen:     Soft, non-tender, bowel sounds  active all four quadrants,    no masses, no organomegaly  Genitalia:    Normal female without lesion, discharge or tenderness; cuff well healed but atrophic     Extremities:   Extremities normal, atraumatic, no cyanosis or edema  Pulses:   2+ and symmetric all extremities  Skin:   Skin color, texture, turgor normal, no rashes or lesions     Assessment:    Healthy female exam.   Vasomotor sx- sx improved with OTC Estroven  Atrophic vaginitis- only has sx with exam Breast cancer screening ventral hernia- asymptomatic. Reviewed emergency signs    Plan:    Mammogram ordered.    Reviewed vaginal lubrication vs moisturizers F/u in 1 year or sooner prn No indications for EES at present pt will f/u if that changes. Referral to surgery prn  Chikita Dogan L. Harraway-Smith, M.D., Cherlynn June

## 2016-05-28 NOTE — Patient Instructions (Signed)
Mammogram A mammogram is an X-ray of the breasts that is done to check for abnormal changes. This procedure can screen for and detect any changes that may suggest breast cancer. A mammogram can also identify other changes and variations in the breast, such as:  Inflammation of the breast tissue (mastitis).  An infected area that contains a collection of pus (abscess).  A fluid-filled sac (cyst).  Fibrocystic changes. This is when breast tissue becomes denser, which can make the tissue feel rope-like or uneven under the skin.  Tumors that are not cancerous (benign). Tell a health care provider about:  Any allergies you have.  If you have breast implants.  If you have had previous breast disease, biopsy, or surgery.  If you are breastfeeding.  Any possibility that you could be pregnant, if this applies.  If you are younger than age 63.  If you have a family history of breast cancer. What are the risks? Generally, this is a safe procedure. However, problems may occur, including:  Exposure to radiation. Radiation levels are very low with this test.  The results being misinterpreted.  The need for further tests.  The inability of the mammogram to detect certain cancers. What happens before the procedure?  Schedule your test about 1-2 weeks after your menstrual period. This is usually when your breasts are the least tender.  If you have had a mammogram done at a different facility in the past, get the mammogram X-rays or have them sent to your current exam facility in order to compare them.  Wash your breasts and under your arms the day of the test.  Do not wear deodorants, perfumes, lotions, or powders anywhere on your body on the day of the test.  Remove any jewelry from your neck.  Wear clothes that you can change into and out of easily. What happens during the procedure?  You will undress from the waist up and put on a gown.  You will stand in front of the  X-ray machine.  Each breast will be placed between two plastic or glass plates. The plates will compress your breast for a few seconds. Try to stay as relaxed as possible during the procedure. This does not cause any harm to your breasts and any discomfort you feel will be very brief.  X-rays will be taken from different angles of each breast. The procedure may vary among health care providers and hospitals. What happens after the procedure?  The mammogram will be examined by a specialist (radiologist).  You may need to repeat certain parts of the test, depending on the quality of the images. This is commonly done if the radiologist needs a better view of the breast tissue.  Ask when your test results will be ready. Make sure you get your test results.  You may resume your normal activities. This information is not intended to replace advice given to you by your health care provider. Make sure you discuss any questions you have with your health care provider. Document Released: 03/20/2000 Document Revised: 08/26/2015 Document Reviewed: 06/01/2014 Elsevier Interactive Patient Education  2017 [employer].  Name of Product Description Perfume- Free Paraben-Free Glycerin-Free PH-balanced Cost per month  Hyalo-GYN Gel in Tampon applicator containing Hydeal-D, a natural source of moisture. Http://www.hyalogyn.com Yes No Yes No $25  Replens Gel in tampon applicator, Clings to vaginal lining to keep it moist Yes Yes No Yes $15-32  K-Y Liquibeads Suppository that melts in the vagina Yes Yes No No $18-36  Neogyn Cream Cream to soothe vulvar dryness and pain, contains cutaneous lysate, a healing ingredient; not internal moisturizer but may help with irritation on the vulvar  LinkMoves.fr Yes Yes No No $39   Vaginal Moisturizers  Lubricants 9if you become sexually active)  - Water or silicone-based  - No perfumes; avoid glycerin or parabens  - If water based look for information  on osmolality  - Lubricate all surfaces as a part of foreplay  - Keep lubricant handy in case more is needed  - Sneaking into bathroom before sex is not a good way to use lubricants

## 2016-05-28 NOTE — Progress Notes (Signed)
Patient scores high on phq9 & gad7, declines seeing Roselyn Reef as she does have a therapist.

## 2016-06-03 ENCOUNTER — Ambulatory Visit
Admission: RE | Admit: 2016-06-03 | Discharge: 2016-06-03 | Disposition: A | Discharge status: Home / Self Care | Payer: Medicare (Managed Care) | Source: Ambulatory Visit | Attending: Obstetrics & Gynecology | Admitting: Obstetrics & Gynecology

## 2016-06-03 DIAGNOSIS — Z1231 Encounter for screening mammogram for malignant neoplasm of breast: Secondary | ICD-10-CM

## 2019-06-26 ENCOUNTER — Ambulatory Visit: Payer: Self-pay | Attending: Internal Medicine

## 2019-06-26 DIAGNOSIS — Z23 Encounter for immunization: Secondary | ICD-10-CM

## 2019-06-26 NOTE — Progress Notes (Signed)
   Covid-19 Vaccination Clinic  Name:  Beverly Bryant    MRN: [id number] DOB: 02-17-1961  06/26/2019  [name] was observed post Covid-19 immunization for 15 minutes without incident. She was provided with Vaccine Information Sheet and instruction to access the V-Safe system.   [name] was instructed to call 911 with any severe reactions post vaccine: Marland Kitchen Difficulty breathing  . Swelling of face and throat  . A fast heartbeat  . A bad rash all over body  . Dizziness and weakness   Immunizations Administered    Name Date Dose VIS Date Route   Pfizer COVID-19 Vaccine 06/26/2019 11:34 AM 0.3 mL 03/17/2019 Intramuscular   Manufacturer: [place]   Lot: [id number]   [place]: [id number]

## 2019-07-19 ENCOUNTER — Ambulatory Visit: Payer: Self-pay | Attending: Internal Medicine

## 2019-07-19 DIAGNOSIS — Z23 Encounter for immunization: Secondary | ICD-10-CM

## 2019-07-19 NOTE — Progress Notes (Signed)
   Covid-19 Vaccination Clinic  Name:  Beverly Bryant    MRN: [id number] DOB: [birth year]  07/19/2019  [name] was observed post Covid-19 immunization for 15 minutes without incident. She was provided with Vaccine Information Sheet and instruction to access the V-Safe system.   [name] was instructed to call 911 with any severe reactions post vaccine: Marland Kitchen Difficulty breathing  . Swelling of face and throat  . A fast heartbeat  . A bad rash all over body  . Dizziness and weakness   Immunizations Administered    Name Date Dose VIS Date Route   Pfizer COVID-19 Vaccine 07/19/2019 11:05 AM 0.3 mL 03/17/2019 Intramuscular   Manufacturer: [place]   Lot: [id number]   [place]: [id number]
